# Patient Record
Sex: Male | Born: 1975 | Race: White | Hispanic: No | Marital: Single | Smoking: Former smoker
Health system: Southern US, Community
[De-identification: ages and names within clinical notes are randomized; demographics above are authoritative.]

## PROBLEM LIST (undated history)

## (undated) DIAGNOSIS — E291 Testicular hypofunction: Secondary | ICD-10-CM

## (undated) DIAGNOSIS — T7840XA Allergy, unspecified, initial encounter: Secondary | ICD-10-CM

## (undated) DIAGNOSIS — R011 Cardiac murmur, unspecified: Secondary | ICD-10-CM

## (undated) HISTORY — DX: Allergy, unspecified, initial encounter: T78.40XA

## (undated) HISTORY — PX: MASTECTOMY: SHX3

## (undated) HISTORY — DX: Testicular hypofunction: E29.1

---

## 2005-07-09 ENCOUNTER — Ambulatory Visit: Payer: Self-pay | Admitting: Family Medicine

## 2015-05-05 ENCOUNTER — Other Ambulatory Visit: Payer: Self-pay | Admitting: Family Medicine

## 2015-05-05 DIAGNOSIS — J302 Other seasonal allergic rhinitis: Secondary | ICD-10-CM

## 2015-08-31 ENCOUNTER — Encounter: Payer: Self-pay | Admitting: Family Medicine

## 2015-08-31 ENCOUNTER — Ambulatory Visit (INDEPENDENT_AMBULATORY_CARE_PROVIDER_SITE_OTHER): Payer: 59 | Admitting: Family Medicine

## 2015-08-31 VITALS — BP 100/64 | HR 64 | Ht 65.0 in | Wt 170.0 lb

## 2015-08-31 DIAGNOSIS — F4322 Adjustment disorder with anxiety: Secondary | ICD-10-CM | POA: Diagnosis not present

## 2015-08-31 DIAGNOSIS — G4733 Obstructive sleep apnea (adult) (pediatric): Secondary | ICD-10-CM

## 2015-08-31 DIAGNOSIS — Z79890 Hormone replacement therapy (postmenopausal): Secondary | ICD-10-CM

## 2015-08-31 DIAGNOSIS — G44209 Tension-type headache, unspecified, not intractable: Secondary | ICD-10-CM

## 2015-08-31 MED ORDER — ALPRAZOLAM 0.25 MG PO TABS
0.2500 mg | ORAL_TABLET | ORAL | Status: DC
Start: 2015-08-31 — End: 2016-01-05

## 2015-08-31 MED ORDER — BD SYRINGE LUER SLIP TIP 3 ML MISC: 1.0000 | Status: DC

## 2015-08-31 MED ORDER — TESTOSTERONE CYPIONATE 200 MG/ML IM SOLN: INTRAMUSCULAR | Status: DC

## 2015-08-31 NOTE — Progress Notes (Signed)
Name: Rick Mason   MRN: [id number]    DOB: May 31, 1976   Date:08/31/2015       Progress Note  Subjective  Chief Complaint  Chief Complaint  Patient presents with  . Headache    has had a migraine x 2 days- started a new job with more stress  . Annual Exam    HPI Brief Questions   Visit Type Male to male transgender patient presents with the following complaints:  General negative  Skin No reported concerns, acne, change in mole, hirsutism, hair loss, change in skin texture, facial rubrum, jaundice, pruritis after hot bath, melasma and other   Eyes No reported concerns, diplopia, loss of peripheral vision, decreased visual activity and other   ENT No reported concerns, voice change, dysphasia, increased snoring and other  negative  Breasts  No concerns  Respiratory headache  Cardiovascular No concerns  Gastrointestnal no concerns  Urologic none  GYN No concerns  Musculoskeletal negative  Neurologic neg   Endocrine diabetic symptoms neg  Heme/Lymph  noconcern  Psychiatric negative    Headache  This is a recurrent problem. The current episode started more than 1 month ago. The problem occurs intermittently. The problem has been gradually worsening. The pain is located in the bilateral (generalized) region. The pain does not radiate. The pain quality is not similar to prior headaches. The quality of the pain is described as aching. The pain is at a severity of 6/10. The pain is moderate. Associated symptoms include blurred vision, dizziness, insomnia, nausea, phonophobia and photophobia. Pertinent negatives include no abdominal pain, back pain, coughing, ear pain, fever, neck pain, sore throat, tingling or weight loss. Associated symptoms comments: Concentration difficulty. The symptoms are aggravated by work. He has tried acetaminophen and NSAIDs for the symptoms. The treatment provided mild relief. There is no history of cluster headaches or  hypertension.  Anxiety Presents for follow-up visit. Symptoms include dizziness, insomnia and nausea. Patient reports no chest pain, nervous/anxious behavior, palpitations, shortness of breath or suicidal ideas. The severity of symptoms is moderate.   Past treatments include nothing.     No problem-specific assessment & plan notes found for this encounter.   Past Medical History  Diagnosis Date  . Hypogonadism in male   . Allergy     Past Surgical History  Procedure Laterality Date  . Mastectomy Bilateral     History reviewed. No pertinent family history.  Social History   Social History  . Marital Status: Single    Spouse Name: N/A  . Number of Children: N/A  . Years of Education: N/A   Occupational History  . Not on file.   Social History Main Topics  . Smoking status: Former Smoker    Quit date: 01/21/2015  . Smokeless tobacco: Not on file  . Alcohol Use: No  . Drug Use: No  . Sexual Activity: Yes   Other Topics Concern  . Not on file   Social History Narrative  . No narrative on file    Allergies  Allergen Reactions  . Penicillins      Review of Systems  Constitutional: Negative for fever, chills, weight loss and malaise/fatigue.  HENT: Negative for ear discharge, ear pain and sore throat.   Eyes: Positive for blurred vision and photophobia.  Respiratory: Negative for cough, sputum production, shortness of breath and wheezing.   Cardiovascular: Negative for chest pain, palpitations and leg swelling.  Gastrointestinal: Positive for nausea. Negative for heartburn, abdominal pain, diarrhea, constipation, blood in stool  and melena.  Genitourinary: Negative for dysuria, urgency, frequency and hematuria.  Musculoskeletal: Negative for myalgias, back pain, joint pain and neck pain.  Skin: Negative for rash.  Neurological: Positive for dizziness and headaches. Negative for tingling, sensory change and focal weakness.  Endo/Heme/Allergies: Negative for  environmental allergies and polydipsia. Does not bruise/bleed easily.  Psychiatric/Behavioral: Negative for depression and suicidal ideas. The patient has insomnia. The patient is not nervous/anxious.      Objective  Filed Vitals:   08/31/15 1045  BP: 100/64  Pulse: 64  Height:  (1.651 m)  Weight: 170 lb (77.111 kg)    Physical Exam  Constitutional: He is oriented to person, place, and time and well-developed, well-nourished, and in no distress.  HENT:  Head: Normocephalic.  Right Ear: External ear normal.  Left Ear: External ear normal.  Nose: Nose normal.  Mouth/Throat: Oropharynx is clear and moist.  Eyes: Conjunctivae and EOM are normal. Pupils are equal, round, and reactive to light. Right eye exhibits no discharge. Left eye exhibits no discharge. No scleral icterus.  Neck: Normal range of motion. Neck supple. No JVD present. No tracheal deviation present. No thyromegaly present.  Cardiovascular: Normal rate, regular rhythm, normal heart sounds and intact distal pulses.  Exam reveals no gallop and no friction rub.   No murmur heard. Pulmonary/Chest: Breath sounds normal. No respiratory distress. He has no wheezes. He has no rales.  Abdominal: Soft. Bowel sounds are normal. He exhibits no mass. There is no hepatosplenomegaly. There is no tenderness. There is no rebound, no guarding and no CVA tenderness.  Musculoskeletal: Normal range of motion. He exhibits no edema or tenderness.  Lymphadenopathy:    He has no cervical adenopathy.  Neurological: He is alert and oriented to person, place, and time. He has normal sensation, normal strength, normal reflexes and intact cranial nerves. No cranial nerve deficit.  Skin: Skin is warm. No rash noted.  Psychiatric: Mood and affect normal.      Assessment & Plan  Problem List Items Addressed This Visit    None    Visit Diagnoses    Hormone replacement therapy (HRT)    -  Primary    Relevant Medications    testosterone  cypionate (DEPOTESTOSTERONE CYPIONATE) 200 MG/ML injection    B-D SYRINGE LUER SLIP TIP 3CC 3 ML MISC    Tension headache        Adjustment disorder with anxious mood        Relevant Medications    ALPRAZolam (XANAX) 0.25 MG tablet    Obstructive sleep apnea hypopnea, moderate        pt to call         Dr. Hayden [facility]  08/31/2015

## 2015-08-31 NOTE — Patient Instructions (Signed)

## 2015-11-15 ENCOUNTER — Encounter: Payer: Self-pay | Admitting: Family Medicine

## 2015-11-15 ENCOUNTER — Ambulatory Visit (INDEPENDENT_AMBULATORY_CARE_PROVIDER_SITE_OTHER): Payer: 59 | Admitting: Family Medicine

## 2015-11-15 VITALS — BP 100/64 | HR 88 | Temp 98.0°F | Ht 65.0 in | Wt 172.0 lb

## 2015-11-15 DIAGNOSIS — J0100 Acute maxillary sinusitis, unspecified: Secondary | ICD-10-CM

## 2015-11-15 DIAGNOSIS — J40 Bronchitis, not specified as acute or chronic: Secondary | ICD-10-CM

## 2015-11-15 MED ORDER — AZITHROMYCIN 250 MG PO TABS: ORAL_TABLET | ORAL | Status: DC

## 2015-11-15 MED ORDER — GUAIFENESIN-CODEINE 100-10 MG/5ML PO SOLN: 5.0000 mL | Freq: Three times a day (TID) | ORAL | Status: DC | PRN

## 2015-11-15 NOTE — Progress Notes (Signed)
Name: Rick Mason   MRN: [id number]    DOB: [birth year]   Date:11/15/2015       Progress Note  Subjective  Chief Complaint  Chief Complaint  Patient presents with  . Sinusitis    cough and cong x 2 weeks- taking Mucinex and Nyquil- coughing up brown mucus    Sinusitis This is a new problem. The current episode started 1 to 4 weeks ago. The problem has been waxing and waning since onset. There has been no fever. He is experiencing no pain. Associated symptoms include congestion, coughing, sinus pressure and sneezing. Pertinent negatives include no chills, diaphoresis, ear pain, headaches, hoarse voice, neck pain, shortness of breath, sore throat or swollen glands. Past treatments include acetaminophen and oral decongestants. The treatment provided no relief.  Cough This is a new problem. The current episode started 1 to 4 weeks ago. The problem has been waxing and waning. The cough is productive of brown sputum. Associated symptoms include nasal congestion, postnasal drip and rhinorrhea. Pertinent negatives include no chest pain, chills, ear pain, fever, headaches, heartburn, hemoptysis, myalgias, rash, sore throat, shortness of breath, weight loss or wheezing. The treatment provided mild relief. There is no history of asthma, bronchiectasis, bronchitis, COPD, emphysema, environmental allergies or pneumonia.    No problem-specific assessment & plan notes found for this encounter.   Past Medical History  Diagnosis Date  . Hypogonadism in male   . Allergy     Past Surgical History  Procedure Laterality Date  . Mastectomy Bilateral     History reviewed. No pertinent family history.  Social History   Social History  . Marital Status: Single    Spouse Name: N/A  . Number of Children: N/A  . Years of Education: N/A   Occupational History  . Not on file.   Social History Main Topics  . Smoking status: Former Smoker    Quit date: 01/21/2015  . Smokeless tobacco: Not on file  .  Alcohol Use: No  . Drug Use: No  . Sexual Activity: Yes   Other Topics Concern  . Not on file   Social History Narrative    Allergies  Allergen Reactions  . Penicillins      Review of Systems  Constitutional: Negative for fever, chills, weight loss, malaise/fatigue and diaphoresis.  HENT: Positive for congestion, postnasal drip, rhinorrhea, sinus pressure and sneezing. Negative for ear discharge, ear pain, hoarse voice and sore throat.   Eyes: Negative for blurred vision.  Respiratory: Positive for cough. Negative for hemoptysis, sputum production, shortness of breath and wheezing.   Cardiovascular: Negative for chest pain, palpitations and leg swelling.  Gastrointestinal: Negative for heartburn, nausea, abdominal pain, diarrhea, constipation, blood in stool and melena.  Genitourinary: Negative for dysuria, urgency, frequency and hematuria.  Musculoskeletal: Negative for myalgias, back pain, joint pain and neck pain.  Skin: Negative for rash.  Neurological: Negative for dizziness, tingling, sensory change, focal weakness and headaches.  Endo/Heme/Allergies: Negative for environmental allergies and polydipsia. Does not bruise/bleed easily.  Psychiatric/Behavioral: Negative for depression and suicidal ideas. The patient is not nervous/anxious and does not have insomnia.      Objective  Filed Vitals:   11/15/15 0937  BP: 100/64  Pulse: 88  Temp: 98 F (36.7 C)  TempSrc: Oral  Height:  (1.651 m)  Weight: 172 lb (78.019 kg)    Physical Exam  Constitutional: He is oriented to person, place, and time and well-developed, well-nourished, and in no distress.  HENT:  Head:  Normocephalic.  Right Ear: External ear normal.  Left Ear: External ear normal.  Nose: Nose normal.  Mouth/Throat: Oropharynx is clear and moist.  Eyes: Conjunctivae and EOM are normal. Pupils are equal, round, and reactive to light. Right eye exhibits no discharge. Left eye exhibits no discharge. No  scleral icterus.  Neck: Normal range of motion. Neck supple. No JVD present. No tracheal deviation present. No thyromegaly present.  Cardiovascular: Normal rate, regular rhythm, normal heart sounds and intact distal pulses.  Exam reveals no gallop and no friction rub.   No murmur heard. Pulmonary/Chest: Breath sounds normal. No respiratory distress. He has no wheezes. He has no rales.  Abdominal: Soft. Bowel sounds are normal. He exhibits no mass. There is no hepatosplenomegaly. There is no tenderness. There is no rebound, no guarding and no CVA tenderness.  Musculoskeletal: Normal range of motion. He exhibits no edema or tenderness.  Lymphadenopathy:    He has no cervical adenopathy.  Neurological: He is alert and oriented to person, place, and time. He has normal sensation, normal strength, normal reflexes and intact cranial nerves. No cranial nerve deficit.  Skin: Skin is warm. No rash noted.  Psychiatric: Mood and affect normal.  Nursing note and vitals reviewed.     Assessment & Plan  Problem List Items Addressed This Visit    None    Visit Diagnoses    Acute maxillary sinusitis, recurrence not specified    -  Primary    Relevant Medications    guaiFENesin-codeine 100-10 MG/5ML syrup    azithromycin (ZITHROMAX) 250 MG tablet    Bronchitis             Dr. Hayden Rasmusseneanna Cyrstal [facility]  11/15/2015

## 2015-12-07 ENCOUNTER — Ambulatory Visit: Payer: Self-pay | Admitting: Family Medicine

## 2015-12-15 ENCOUNTER — Ambulatory Visit: Payer: Self-pay | Admitting: Family Medicine

## 2016-01-05 ENCOUNTER — Encounter: Payer: Self-pay | Admitting: Family Medicine

## 2016-01-05 ENCOUNTER — Ambulatory Visit (INDEPENDENT_AMBULATORY_CARE_PROVIDER_SITE_OTHER): Payer: 59 | Admitting: Family Medicine

## 2016-01-05 VITALS — BP 120/80 | HR 84 | Ht 65.0 in | Wt 170.0 lb

## 2016-01-05 DIAGNOSIS — Z79899 Other long term (current) drug therapy: Secondary | ICD-10-CM

## 2016-01-05 DIAGNOSIS — Z79890 Hormone replacement therapy (postmenopausal): Secondary | ICD-10-CM

## 2016-01-05 DIAGNOSIS — Z124 Encounter for screening for malignant neoplasm of cervix: Secondary | ICD-10-CM

## 2016-01-05 DIAGNOSIS — Z1211 Encounter for screening for malignant neoplasm of colon: Secondary | ICD-10-CM

## 2016-01-05 DIAGNOSIS — Z0000 Encounter for general adult medical examination without abnormal findings: Secondary | ICD-10-CM | POA: Diagnosis not present

## 2016-01-05 DIAGNOSIS — G4730 Sleep apnea, unspecified: Secondary | ICD-10-CM | POA: Diagnosis not present

## 2016-01-05 LAB — HEMOCCULT GUIAC POC 1CARD (OFFICE): Fecal Occult Blood, POC: NEGATIVE

## 2016-01-05 MED ORDER — TESTOSTERONE CYPIONATE 200 MG/ML IM SOLN: INTRAMUSCULAR | Status: DC

## 2016-01-05 NOTE — Progress Notes (Addendum)
Name: Rick Mason   MRN: [id number]    DOB: Apr 15, 1976   Date:01/05/2016       Progress Note  Subjective  Chief Complaint  Chief Complaint  Patient presents with  . Allergic Rhinitis   . Hypogonadism  . Sleep Apnea    Other Chronicity: sleep apnea. The current episode started more than 1 year ago. The problem has been gradually worsening. Associated symptoms comments: Snores/ daytime somnolence/ witness apneic episodes. Nothing aggravates the symptoms. The treatment provided no relief.    No problem-specific assessment & plan notes found for this encounter.   Past Medical History  Diagnosis Date  . Hypogonadism in male   . Allergy     Past Surgical History  Procedure Laterality Date  . Mastectomy Bilateral     Family History  Problem Relation Age of Onset  . Heart disease Father   . Cancer Maternal Grandfather   . Heart disease Maternal Grandfather   . Cancer Paternal Grandmother   . Cancer Paternal Grandfather     Social History   Social History  . Marital Status: Single    Spouse Name: N/A  . Number of Children: N/A  . Years of Education: N/A   Occupational History  . Not on file.   Social History Main Topics  . Smoking status: Former Smoker    Quit date: 01/21/2015  . Smokeless tobacco: Not on file  . Alcohol Use: No  . Drug Use: No  . Sexual Activity: Yes   Other Topics Concern  . Not on file   Social History Narrative    Allergies  Allergen Reactions  . Penicillins      ROS   Objective  Filed Vitals:   01/05/16 0939  BP: 120/80  Pulse: 84  Height:  (1.651 m)  Weight: 170 lb (77.111 kg)    Physical Exam    Assessment & Plan  Problem List Items Addressed This Visit    None    Visit Diagnoses    Hormone replacement therapy (HRT)    -  Primary    Relevant Medications    testosterone cypionate (DEPOTESTOSTERONE CYPIONATE) 200 MG/ML injection    Other Relevant Orders    Pap IG (Image Guided)    Healthcare  maintenance        Relevant Orders    POCT Occult Blood Stool (Completed)    Encounter for long-term (current) drug use        Relevant Orders    Lipid Profile    Renal Function Panel    Hemoglobin    Cervical cancer screening        Relevant Orders    Pap IG (Image Guided)    Colon cancer screening        Relevant Orders    POCT Occult Blood Stool (Completed)    Sleep apnea        referral for eval sleep apnea         Dr. Elizabeth Sauer [facility]  01/05/2016

## 2016-01-05 NOTE — Addendum Note (Signed)
Addended by: Elizabeth Sauer C on: 01/05/2016 11:56 AM   Modules accepted: Kipp Brood

## 2016-01-05 NOTE — Progress Notes (Signed)
Name: Rick Mason   MRN: [id number]    DOB: 1976/11/15   Date:01/05/2016       Progress Note  Subjective  Chief Complaint  Chief Complaint  Patient presents with  . Allergic Rhinitis   . Hypogonadism  . Sleep Apnea    HPI Comments: Patient presents for hormone replacement therapy.   No problem-specific assessment & plan notes found for this encounter.   Past Medical History  Diagnosis Date  . Hypogonadism in male   . Allergy     Past Surgical History  Procedure Laterality Date  . Mastectomy Bilateral     Family History  Problem Relation Age of Onset  . Heart disease Father   . Cancer Maternal Grandfather   . Heart disease Maternal Grandfather   . Cancer Paternal Grandmother   . Cancer Paternal Grandfather     Social History   Social History  . Marital Status: Single    Spouse Name: N/A  . Number of Children: N/A  . Years of Education: N/A   Occupational History  . Not on file.   Social History Main Topics  . Smoking status: Former Smoker    Quit date: 01/21/2015  . Smokeless tobacco: Not on file  . Alcohol Use: No  . Drug Use: No  . Sexual Activity: Yes   Other Topics Concern  . Not on file   Social History Narrative    Allergies  Allergen Reactions  . Penicillins      Review of Systems  Constitutional: Negative for fever, chills, weight loss and malaise/fatigue.  HENT: Negative for ear discharge, ear pain and sore throat.   Eyes: Negative for blurred vision.  Respiratory: Negative for cough, sputum production, shortness of breath and wheezing.   Cardiovascular: Negative for chest pain, palpitations and leg swelling.  Gastrointestinal: Negative for heartburn, nausea, abdominal pain, diarrhea, constipation, blood in stool and melena.  Genitourinary: Negative for dysuria, urgency, frequency and hematuria.  Musculoskeletal: Negative for myalgias, back pain, joint pain and neck pain.  Skin: Negative for rash.  Neurological: Negative for  dizziness, tingling, sensory change, focal weakness and headaches.  Endo/Heme/Allergies: Negative for environmental allergies and polydipsia. Does not bruise/bleed easily.  Psychiatric/Behavioral: Negative for depression and suicidal ideas. The patient is not nervous/anxious and does not have insomnia.      Objective  Filed Vitals:   01/05/16 0939  BP: 120/80  Pulse: 84  Height:  (1.651 m)  Weight: 170 lb (77.111 kg)    Physical Exam  Constitutional: He is oriented to person, place, and time and well-developed, well-nourished, and in no distress.  HENT:  Head: Normocephalic.  Right Ear: External ear normal.  Left Ear: External ear normal.  Nose: Nose normal.  Mouth/Throat: Oropharynx is clear and moist.  Eyes: Conjunctivae and EOM are normal. Pupils are equal, round, and reactive to light. Right eye exhibits no discharge. Left eye exhibits no discharge. No scleral icterus.  Neck: Normal range of motion. Neck supple. No JVD present. No tracheal deviation present. No thyromegaly present.  Cardiovascular: Normal rate, regular rhythm, normal heart sounds and intact distal pulses.  Exam reveals no gallop and no friction rub.   No murmur heard. Pulmonary/Chest: Breath sounds normal. No respiratory distress. He has no wheezes. He has no rales.  Abdominal: Soft. Bowel sounds are normal. He exhibits no mass. There is no hepatosplenomegaly. There is no tenderness. There is no rebound, no guarding and no CVA tenderness.  Genitourinary: Rectum normal.  Ext nl/vag nl/uterus  nl/ adnexa nl/ rectal nl  Musculoskeletal: Normal range of motion. He exhibits no edema or tenderness.  Lymphadenopathy:    He has no cervical adenopathy.  Neurological: He is alert and oriented to person, place, and time. He has normal sensation, normal strength, normal reflexes and intact cranial nerves. No cranial nerve deficit.  Skin: Skin is warm. No rash noted.  Psychiatric: Mood and affect normal.  Nursing  note and vitals reviewed.     Assessment & Plan  Problem List Items Addressed This Visit    None    Visit Diagnoses    Hormone replacement therapy (HRT)    -  Primary    Relevant Medications    testosterone cypionate (DEPOTESTOSTERONE CYPIONATE) 200 MG/ML injection    Other Relevant Orders    Pap IG (Image Guided)    Healthcare maintenance        Relevant Orders    POCT Occult Blood Stool (Completed)    Encounter for long-term (current) drug use        Relevant Orders    Lipid Profile    Renal Function Panel    Hemoglobin    Cervical cancer screening        Relevant Orders    Pap IG (Image Guided)    Colon cancer screening        Relevant Orders    POCT Occult Blood Stool (Completed)         Dr. Elizabeth Sauer [facility]  01/05/2016

## 2016-01-05 NOTE — Addendum Note (Signed)
Addended by: Elizabeth Sauer C on: 01/05/2016 11:55 AM   Modules accepted: Kipp Brood

## 2016-01-06 LAB — RENAL FUNCTION PANEL
ALBUMIN: 4.8 g/dL (ref 3.5–5.5)
BUN/Creatinine Ratio: 7 — ABNORMAL LOW (ref 8–19)
BUN: 8 mg/dL (ref 6–20)
CALCIUM: 9.6 mg/dL (ref 8.7–10.2)
CHLORIDE: 101 mmol/L (ref 96–106)
CO2: 24 mmol/L (ref 18–29)
Creatinine, Ser: 1.09 mg/dL (ref 0.76–1.27)
GFR calc Af Amer: 100 mL/min/{1.73_m2} (ref >59)
GFR calc non Af Amer: 86 mL/min/{1.73_m2} (ref >59)
Glucose: 88 mg/dL (ref 65–99)
Phosphorus: 2.8 mg/dL (ref 2.5–4.5)
Potassium: 4.5 mmol/L (ref 3.5–5.2)
Sodium: 142 mmol/L (ref 134–144)

## 2016-01-06 LAB — LIPID PANEL
Chol/HDL Ratio: 5.9 ratio units — ABNORMAL HIGH (ref 0.0–5.0)
Cholesterol, Total: 218 mg/dL — ABNORMAL HIGH (ref 100–199)
HDL: 37 mg/dL — ABNORMAL LOW (ref >39)
LDL Calculated: 155 mg/dL — ABNORMAL HIGH (ref 0–99)
TRIGLYCERIDES: 130 mg/dL (ref 0–149)
VLDL CHOLESTEROL CAL: 26 mg/dL (ref 5–40)

## 2016-01-06 LAB — HEMOGLOBIN: HEMOGLOBIN: 16.5 g/dL (ref 12.6–17.7)

## 2016-01-09 LAB — PAP IG (IMAGE GUIDED): PAP SMEAR COMMENT: 0

## 2016-02-27 ENCOUNTER — Encounter: Payer: Self-pay | Admitting: Family Medicine

## 2016-02-27 ENCOUNTER — Ambulatory Visit (INDEPENDENT_AMBULATORY_CARE_PROVIDER_SITE_OTHER): Payer: 59 | Admitting: Family Medicine

## 2016-02-27 ENCOUNTER — Ambulatory Visit
Admission: RE | Admit: 2016-02-27 | Discharge: 2016-02-27 | Disposition: A | Discharge status: Home / Self Care | Payer: 59 | Source: Ambulatory Visit | Attending: Family Medicine | Admitting: Family Medicine

## 2016-02-27 VITALS — BP 120/80 | HR 68 | Ht 65.0 in | Wt 169.0 lb

## 2016-02-27 DIAGNOSIS — M25471 Effusion, right ankle: Secondary | ICD-10-CM | POA: Insufficient documentation

## 2016-02-27 DIAGNOSIS — X58XXXA Exposure to other specified factors, initial encounter: Secondary | ICD-10-CM | POA: Insufficient documentation

## 2016-02-27 DIAGNOSIS — S93401A Sprain of unspecified ligament of right ankle, initial encounter: Secondary | ICD-10-CM | POA: Diagnosis not present

## 2016-02-27 NOTE — Progress Notes (Signed)
Name: Rick Mason   MRN: [id number]    DOB: [birth year]   Date:02/27/2016       Progress Note  Subjective  Chief Complaint  Chief Complaint  Patient presents with  . Ankle Pain    sprained R) ankle x 1 month ago- still bothering him    Ankle Pain  The incident occurred more than 1 week ago (6 weeks ago). The incident occurred in the street. The injury mechanism was a fall. The pain is present in the right ankle. The quality of the pain is described as aching. The pain is moderate. The pain has been improving since onset. Associated symptoms include a loss of motion and muscle weakness. Pertinent negatives include no inability to bear weight or tingling. The symptoms are aggravated by movement, palpation and weight bearing. He has tried NSAIDs for the symptoms. The treatment provided mild relief.    No problem-specific assessment & plan notes found for this encounter.   Past Medical History  Diagnosis Date  . Hypogonadism in male   . Allergy     Past Surgical History  Procedure Laterality Date  . Mastectomy Bilateral     Family History  Problem Relation Age of Onset  . Heart disease Father   . Cancer Maternal Grandfather   . Heart disease Maternal Grandfather   . Cancer Paternal Grandmother   . Cancer Paternal Grandfather     Social History   Social History  . Marital Status: Single    Spouse Name: N/A  . Number of Children: N/A  . Years of Education: N/A   Occupational History  . Not on file.   Social History Main Topics  . Smoking status: Former Smoker    Quit date: 01/21/2015  . Smokeless tobacco: Not on file  . Alcohol Use: No  . Drug Use: No  . Sexual Activity: Yes   Other Topics Concern  . Not on file   Social History Narrative    Allergies  Allergen Reactions  . Penicillins      Review of Systems  Constitutional: Negative for fever, chills, weight loss and malaise/fatigue.  HENT: Negative for ear discharge, ear pain and sore throat.    Eyes: Negative for blurred vision.  Respiratory: Negative for cough, sputum production, shortness of breath and wheezing.   Cardiovascular: Negative for chest pain, palpitations and leg swelling.  Gastrointestinal: Negative for heartburn, nausea, abdominal pain, diarrhea, constipation, blood in stool and melena.  Genitourinary: Negative for dysuria, urgency, frequency and hematuria.  Musculoskeletal: Positive for joint pain. Negative for myalgias, back pain and neck pain.  Skin: Negative for rash.  Neurological: Negative for dizziness, tingling, sensory change, focal weakness and headaches.  Endo/Heme/Allergies: Negative for environmental allergies and polydipsia. Does not bruise/bleed easily.  Psychiatric/Behavioral: Negative for depression and suicidal ideas. The patient is not nervous/anxious and does not have insomnia.      Objective  Filed Vitals:   02/27/16 1342  BP: 120/80  Pulse: 68  Height:  (1.651 m)  Weight: 169 lb (76.658 kg)    Physical Exam  Constitutional: He is oriented to person, place, and time and well-developed, well-nourished, and in no distress.  HENT:  Head: Normocephalic.  Right Ear: External ear normal.  Left Ear: External ear normal.  Nose: Nose normal.  Mouth/Throat: Oropharynx is clear and moist.  Eyes: Conjunctivae and EOM are normal. Pupils are equal, round, and reactive to light. Right eye exhibits no discharge. Left eye exhibits no discharge. No scleral icterus.  Neck: Normal range of motion. Neck supple. No JVD present. No tracheal deviation present. No thyromegaly present.  Cardiovascular: Normal rate, regular rhythm, normal heart sounds and intact distal pulses.  Exam reveals no gallop and no friction rub.   No murmur heard. Pulmonary/Chest: Breath sounds normal. No respiratory distress. He has no wheezes. He has no rales.  Abdominal: Soft. Bowel sounds are normal. He exhibits no mass. There is no hepatosplenomegaly. There is no  tenderness. There is no rebound, no guarding and no CVA tenderness.  Musculoskeletal: Normal range of motion. He exhibits no edema or tenderness.  Lymphadenopathy:    He has no cervical adenopathy.  Neurological: He is alert and oriented to person, place, and time. He has normal sensation, normal strength, normal reflexes and intact cranial nerves. No cranial nerve deficit.  Skin: Skin is warm. No rash noted.  Psychiatric: Mood and affect normal.  Nursing note and vitals reviewed.     Assessment & Plan  Problem List Items Addressed This Visit    None    Visit Diagnoses    Ankle sprain, right, initial encounter    -  Primary    Relevant Orders    DG Ankle Complete Right (Completed)         Dr. [facility]  02/27/2016

## 2016-05-19 ENCOUNTER — Other Ambulatory Visit: Payer: Self-pay | Admitting: Family Medicine

## 2016-07-17 ENCOUNTER — Other Ambulatory Visit: Payer: Self-pay | Admitting: Family Medicine

## 2016-07-17 DIAGNOSIS — Z79890 Hormone replacement therapy (postmenopausal): Secondary | ICD-10-CM

## 2016-07-23 ENCOUNTER — Encounter (INDEPENDENT_AMBULATORY_CARE_PROVIDER_SITE_OTHER): Payer: Self-pay

## 2016-07-23 ENCOUNTER — Encounter: Payer: Self-pay | Admitting: Family Medicine

## 2016-07-23 ENCOUNTER — Ambulatory Visit (INDEPENDENT_AMBULATORY_CARE_PROVIDER_SITE_OTHER): Payer: 59 | Admitting: Family Medicine

## 2016-07-23 VITALS — BP 120/80 | HR 78 | Ht 65.0 in | Wt 169.0 lb

## 2016-07-23 DIAGNOSIS — Z79890 Hormone replacement therapy (postmenopausal): Secondary | ICD-10-CM

## 2016-07-23 DIAGNOSIS — J302 Other seasonal allergic rhinitis: Secondary | ICD-10-CM | POA: Diagnosis not present

## 2016-07-23 DIAGNOSIS — M19079 Primary osteoarthritis, unspecified ankle and foot: Secondary | ICD-10-CM

## 2016-07-23 DIAGNOSIS — B349 Viral infection, unspecified: Secondary | ICD-10-CM

## 2016-07-23 DIAGNOSIS — M129 Arthropathy, unspecified: Secondary | ICD-10-CM | POA: Diagnosis not present

## 2016-07-23 MED ORDER — TESTOSTERONE CYPIONATE 200 MG/ML IM SOLN: INTRAMUSCULAR | 2 refills | Status: DC

## 2016-07-23 MED ORDER — VALACYCLOVIR HCL 1 G PO TABS: ORAL_TABLET | ORAL | 11 refills | Status: DC

## 2016-07-23 MED ORDER — LORATADINE 10 MG PO TABS: 10.0000 mg | ORAL_TABLET | ORAL | 11 refills | Status: DC | PRN

## 2016-07-23 MED ORDER — FLUTICASONE PROPIONATE 50 MCG/ACT NA SUSP
NASAL | 11 refills | Status: DC
Start: 2016-07-23 — End: 2017-03-06

## 2016-07-23 MED ORDER — BD SYRINGE LUER SLIP TIP 3 ML MISC: 1.0000 | 11 refills | Status: DC

## 2016-07-23 MED ORDER — IBUPROFEN 800 MG PO TABS: 800.0000 mg | ORAL_TABLET | Freq: Three times a day (TID) | ORAL | 11 refills | Status: DC | PRN

## 2016-07-23 NOTE — Progress Notes (Signed)
Name: Rick Mason   MRN: [id number]    DOB: 11/01/1976   Date:07/23/2016       Progress Note  Subjective  Chief Complaint  Chief Complaint  Patient presents with  . hormone replacement therapy  . Mouth Lesions  . Allergic Rhinitis     Patient presents for medication hormone replacement therapy.   Mouth Lesions   The current episode started more than 1 week ago. The onset was gradual. The problem occurs continuously. The problem has been unchanged. The problem is moderate. Nothing relieves the symptoms. Nothing aggravates the symptoms. Associated symptoms include mouth sores. Pertinent negatives include no fever, no decreased vision, no double vision, no eye itching, no photophobia, no abdominal pain, no constipation, no diarrhea, no nausea, no congestion, no ear discharge, no ear pain, no headaches, no hearing loss, no rhinorrhea, no sore throat, no stridor, no swollen glands, no neck pain, no cough, no wheezing, no rash, no eye discharge, no eye pain and no eye redness.    No problem-specific Assessment & Plan notes found for this encounter.   Past Medical History:  Diagnosis Date  . Allergy   . Hypogonadism in male     Past Surgical History:  Procedure Laterality Date  . MASTECTOMY Bilateral     Family History  Problem Relation Age of Onset  . Heart disease Father   . Cancer Maternal Grandfather   . Heart disease Maternal Grandfather   . Cancer Paternal Grandmother   . Cancer Paternal Grandfather     Social History   Social History  . Marital status: Single    Spouse name: N/A  . Number of children: N/A  . Years of education: N/A   Occupational History  . Not on file.   Social History Main Topics  . Smoking status: Former Smoker    Quit date: 01/21/2015  . Smokeless tobacco: Not on file  . Alcohol use No  . Drug use: No  . Sexual activity: Yes   Other Topics Concern  . Not on file   Social History Narrative  . No narrative on file    Allergies   Allergen Reactions  . Penicillins      Review of Systems  Constitutional: Negative for chills, fever, malaise/fatigue and weight loss.  HENT: Positive for mouth sores. Negative for congestion, ear discharge, ear pain, hearing loss, rhinorrhea and sore throat.   Eyes: Negative for blurred vision, double vision, photophobia, pain, discharge, redness and itching.  Respiratory: Negative for cough, sputum production, shortness of breath, wheezing and stridor.   Cardiovascular: Negative for chest pain, palpitations and leg swelling.  Gastrointestinal: Negative for abdominal pain, blood in stool, constipation, diarrhea, heartburn, melena and nausea.  Genitourinary: Negative for dysuria, frequency, hematuria and urgency.  Musculoskeletal: Negative for back pain, joint pain, myalgias and neck pain.  Skin: Negative for rash.  Neurological: Negative for dizziness, tingling, sensory change, focal weakness and headaches.  Endo/Heme/Allergies: Negative for environmental allergies and polydipsia. Does not bruise/bleed easily.  Psychiatric/Behavioral: Negative for depression and suicidal ideas. The patient is not nervous/anxious and does not have insomnia.      Objective  Vitals:   07/23/16 0833  BP: 120/80  Pulse: 78  Weight: 169 lb (76.7 kg)  Height: 5\' 5"  (1.651 m)    Physical Exam  Constitutional: He is oriented to person, place, and time and well-developed, well-nourished, and in no distress.  HENT:  Head: Normocephalic.  Right Ear: External ear normal.  Left Ear: External ear normal.  Nose: Nose normal.  Mouth/Throat: Oropharynx is clear and moist.  Eyes: Conjunctivae and EOM are normal. Pupils are equal, round, and reactive to light. Right eye exhibits no discharge. Left eye exhibits no discharge. No scleral icterus.  Neck: Normal range of motion. Neck supple. No JVD present. No tracheal deviation present. No thyromegaly present.  Cardiovascular: Normal rate, regular rhythm, normal  heart sounds and intact distal pulses.  Exam reveals no gallop and no friction rub.   No murmur heard. Pulmonary/Chest: Breath sounds normal. No respiratory distress. He has no wheezes. He has no rales.  Abdominal: Soft. Bowel sounds are normal. He exhibits no mass. There is no hepatosplenomegaly. There is no tenderness. There is no rebound, no guarding and no CVA tenderness.  Musculoskeletal: Normal range of motion. He exhibits no edema or tenderness.  Lymphadenopathy:    He has no cervical adenopathy.  Neurological: He is alert and oriented to person, place, and time. He has normal sensation, normal strength, normal reflexes and intact cranial nerves. No cranial nerve deficit.  Skin: Skin is warm. No rash noted.  Psychiatric: Mood and affect normal.  Nursing note and vitals reviewed.     Assessment & Plan  Problem List Items Addressed This Visit    None    Visit Diagnoses    Hormone replacement therapy (HRT)    -  Primary   Relevant Medications   testosterone cypionate (DEPOTESTOSTERONE CYPIONATE) 200 MG/ML injection   B-D SYRINGE LUER SLIP TIP 3CC 3 ML MISC   Viral syndrome       Relevant Medications   valACYclovir (VALTREX) 1000 MG tablet   Ankle arthritis       Other seasonal allergic rhinitis       Relevant Medications   loratadine (CLARITIN) 10 MG tablet   fluticasone (FLONASE) 50 MCG/ACT nasal spray        Dr. Hayden Rasmusseneanna Emri Sample [facility]  07/23/16

## 2016-09-13 ENCOUNTER — Other Ambulatory Visit: Payer: Self-pay

## 2016-09-13 DIAGNOSIS — G4719 Other hypersomnia: Secondary | ICD-10-CM

## 2016-09-13 DIAGNOSIS — R0683 Snoring: Secondary | ICD-10-CM

## 2016-09-17 ENCOUNTER — Other Ambulatory Visit: Payer: Self-pay

## 2016-09-17 DIAGNOSIS — M25579 Pain in unspecified ankle and joints of unspecified foot: Secondary | ICD-10-CM

## 2016-10-21 ENCOUNTER — Other Ambulatory Visit: Payer: Self-pay

## 2016-10-22 ENCOUNTER — Ambulatory Visit: Payer: 59 | Attending: Specialist

## 2016-10-22 DIAGNOSIS — R0683 Snoring: Secondary | ICD-10-CM | POA: Diagnosis present

## 2016-10-22 DIAGNOSIS — G4730 Sleep apnea, unspecified: Secondary | ICD-10-CM | POA: Insufficient documentation

## 2017-03-06 ENCOUNTER — Other Ambulatory Visit: Payer: Self-pay

## 2017-03-06 ENCOUNTER — Encounter: Payer: Self-pay | Admitting: Family Medicine

## 2017-03-06 ENCOUNTER — Ambulatory Visit (INDEPENDENT_AMBULATORY_CARE_PROVIDER_SITE_OTHER): Payer: 59 | Admitting: Family Medicine

## 2017-03-06 ENCOUNTER — Telehealth: Payer: Self-pay

## 2017-03-06 VITALS — BP 120/64 | HR 100 | Ht 64.0 in | Wt 163.0 lb

## 2017-03-06 DIAGNOSIS — J302 Other seasonal allergic rhinitis: Secondary | ICD-10-CM | POA: Diagnosis not present

## 2017-03-06 DIAGNOSIS — J40 Bronchitis, not specified as acute or chronic: Secondary | ICD-10-CM | POA: Diagnosis not present

## 2017-03-06 DIAGNOSIS — J0100 Acute maxillary sinusitis, unspecified: Secondary | ICD-10-CM | POA: Diagnosis not present

## 2017-03-06 DIAGNOSIS — Z79890 Hormone replacement therapy (postmenopausal): Secondary | ICD-10-CM

## 2017-03-06 MED ORDER — GUAIFENESIN-CODEINE 100-10 MG/5ML PO SYRP: 5.0000 mL | ORAL_SOLUTION | Freq: Three times a day (TID) | ORAL | 0 refills | Status: DC | PRN

## 2017-03-06 MED ORDER — BD SYRINGE LUER SLIP TIP 3 ML MISC: 1.0000 | 11 refills | Status: DC

## 2017-03-06 MED ORDER — FLUTICASONE PROPIONATE 50 MCG/ACT NA SUSP: NASAL | 11 refills | Status: DC

## 2017-03-06 MED ORDER — TESTOSTERONE CYPIONATE 200 MG/ML IM SOLN: INTRAMUSCULAR | 2 refills | Status: DC

## 2017-03-06 MED ORDER — LORATADINE 10 MG PO TABS: 10.0000 mg | ORAL_TABLET | ORAL | 11 refills | Status: DC | PRN

## 2017-03-06 MED ORDER — AZITHROMYCIN 250 MG PO TABS: ORAL_TABLET | ORAL | 1 refills | Status: DC

## 2017-03-06 NOTE — Telephone Encounter (Signed)
Patient called and wanted testosterone refilled- need to sched appt. Also wanted something for cough/cold. Tell him you can put him on today at 2:00. Thank you

## 2017-03-06 NOTE — Progress Notes (Signed)
Name: Rick Mason   MRN: [id number]    DOB: 1976/05/07   Date:03/06/2017       Progress Note  Subjective  Chief Complaint  Chief Complaint  Patient presents with  . Allergic Rhinitis   . hrt    Needs syringes, needles, flonase nasal spray, and testosterone refilled.  . Bronchitis    brown/ green production, cough- taking Nyquil at night to help sleep and Mucinex during day- has helped some    Patient presents for HRT /testosterone refill.   Sinusitis  This is a new problem. The current episode started in the past 7 days. The problem has been gradually worsening since onset. There has been no fever. The pain is mild. Associated symptoms include congestion, coughing, sinus pressure and a sore throat. Pertinent negatives include no chills, diaphoresis, ear pain, headaches, hoarse voice, neck pain, shortness of breath, sneezing or swollen glands. Past treatments include oral decongestants. The treatment provided mild relief.  Cough  This is a new problem. The current episode started in the past 7 days. The problem has been gradually worsening. The cough is productive of purulent sputum. Associated symptoms include nasal congestion, postnasal drip, a sore throat and wheezing. Pertinent negatives include no chest pain, chills, ear congestion, ear pain, fever, headaches, heartburn, hemoptysis, myalgias, rash, rhinorrhea, shortness of breath, sweats or weight loss. The symptoms are aggravated by pollens. The treatment provided mild relief. His past medical history is significant for bronchitis. There is no history of COPD or environmental allergies.    No problem-specific Assessment & Plan notes found for this encounter.   Past Medical History:  Diagnosis Date  . Allergy   . Hypogonadism in male     Past Surgical History:  Procedure Laterality Date  . MASTECTOMY Bilateral     Family History  Problem Relation Age of Onset  . Heart disease Father   . Cancer Maternal Grandfather   .  Heart disease Maternal Grandfather   . Cancer Paternal Grandmother   . Cancer Paternal Grandfather     Social History   Social History  . Marital status: Single    Spouse name: N/A  . Number of children: N/A  . Years of education: N/A   Occupational History  . Not on file.   Social History Main Topics  . Smoking status: Former Smoker    Quit date: 01/21/2015  . Smokeless tobacco: Never Used  . Alcohol use No  . Drug use: No  . Sexual activity: Yes   Other Topics Concern  . Not on file   Social History Narrative  . No narrative on file    Allergies  Allergen Reactions  . Penicillins     Outpatient Medications Prior to Visit  Medication Sig Dispense Refill  . ibuprofen (ADVIL,MOTRIN) 800 MG tablet Take 1 tablet (800 mg total) by mouth every 8 (eight) hours as needed. 30 tablet 11  . valACYclovir (VALTREX) 1000 MG tablet take 1 tablet by mouth if needed 30 tablet 11  . B-D SYRINGE LUER SLIP TIP 3CC 3 ML MISC Inject 1 each as directed every 14 (fourteen) days. 30 each 11  . fluticasone (FLONASE) 50 MCG/ACT nasal spray instill 1 spray into each nostril once daily 16 g 11  . loratadine (CLARITIN) 10 MG tablet Take 1 tablet (10 mg total) by mouth as needed. 30 tablet 11  . testosterone cypionate (DEPOTESTOSTERONE CYPIONATE) 200 MG/ML injection INJECT 1 AND 1/4 INTRAMUSCULAR EVERY 2 WEEKS 10 mL 2   No facility-administered  medications prior to visit.     Review of Systems  Constitutional: Negative for chills, diaphoresis, fever, malaise/fatigue and weight loss.  HENT: Positive for congestion, postnasal drip, sinus pressure and sore throat. Negative for ear discharge, ear pain, hoarse voice, rhinorrhea and sneezing.   Eyes: Negative for blurred vision.  Respiratory: Positive for cough and wheezing. Negative for hemoptysis, sputum production and shortness of breath.   Cardiovascular: Negative for chest pain, palpitations and leg swelling.  Gastrointestinal: Negative for  abdominal pain, blood in stool, constipation, diarrhea, heartburn, melena and nausea.  Genitourinary: Negative for dysuria, frequency, hematuria and urgency.  Musculoskeletal: Negative for back pain, joint pain, myalgias and neck pain.  Skin: Negative for rash.  Neurological: Negative for dizziness, tingling, sensory change, focal weakness and headaches.  Endo/Heme/Allergies: Negative for environmental allergies and polydipsia. Does not bruise/bleed easily.  Psychiatric/Behavioral: Negative for depression and suicidal ideas. The patient is not nervous/anxious and does not have insomnia.      Objective  Vitals:   03/06/17 1142  BP: 120/64  Pulse: 100  Weight: 163 lb (73.9 kg)  Height:  (1.626 m)    Physical Exam  Constitutional: He is oriented to person, place, and time and well-developed, well-nourished, and in no distress.  HENT:  Head: Normocephalic.  Right Ear: External ear normal.  Left Ear: External ear normal.  Nose: Nose normal.  Mouth/Throat: Oropharynx is clear and moist.  Eyes: Conjunctivae and EOM are normal. Pupils are equal, round, and reactive to light. Right eye exhibits no discharge. Left eye exhibits no discharge. No scleral icterus.  Neck: Normal range of motion. Neck supple. No JVD present. No tracheal deviation present. No thyromegaly present.  Cardiovascular: Normal rate, regular rhythm, normal heart sounds and intact distal pulses.  Exam reveals no gallop and no friction rub.   No murmur heard. Pulmonary/Chest: Breath sounds normal. No respiratory distress. He has no wheezes. He has no rales.  Abdominal: Soft. Bowel sounds are normal. He exhibits no mass. There is no hepatosplenomegaly. There is no tenderness. There is no rebound, no guarding and no CVA tenderness.  Musculoskeletal: Normal range of motion. He exhibits no edema or tenderness.  Lymphadenopathy:    He has no cervical adenopathy.  Neurological: He is alert and oriented to person, place, and  time. He has normal sensation, normal strength and intact cranial nerves. No cranial nerve deficit.  Skin: Skin is warm. No rash noted.  Psychiatric: Mood and affect normal.  Nursing note and vitals reviewed.     Assessment & Plan  Problem List Items Addressed This Visit    None    Visit Diagnoses    Acute non-recurrent maxillary sinusitis    -  Primary   Relevant Medications   fluticasone (FLONASE) 50 MCG/ACT nasal spray   loratadine (CLARITIN) 10 MG tablet   azithromycin (ZITHROMAX) 250 MG tablet   guaiFENesin-codeine (ROBITUSSIN AC) 100-10 MG/5ML syrup   Hormone replacement therapy (HRT)       Relevant Medications   B-D SYRINGE LUER SLIP TIP 3CC 3 ML MISC   testosterone cypionate (DEPOTESTOSTERONE CYPIONATE) 200 MG/ML injection   Other seasonal allergic rhinitis       Relevant Medications   fluticasone (FLONASE) 50 MCG/ACT nasal spray   loratadine (CLARITIN) 10 MG tablet   Bronchitis          Meds ordered this encounter  Medications  . B-D SYRINGE LUER SLIP TIP 3CC 3 ML MISC    Sig: Inject 1 each as directed  every 14 (fourteen) days.    Dispense:  30 each    Refill:  11  . testosterone cypionate (DEPOTESTOSTERONE CYPIONATE) 200 MG/ML injection    Sig: INJECT 1 AND 1/4 INTRAMUSCULAR EVERY 2 WEEKS    Dispense:  10 mL    Refill:  2  . fluticasone (FLONASE) 50 MCG/ACT nasal spray    Sig: instill 1 spray into each nostril once daily    Dispense:  16 g    Refill:  11  . loratadine (CLARITIN) 10 MG tablet    Sig: Take 1 tablet (10 mg total) by mouth as needed.    Dispense:  30 tablet    Refill:  11  . azithromycin (ZITHROMAX) 250 MG tablet    Sig: 2 today then 1 a day for 4 days    Dispense:  6 tablet    Refill:  1  . guaiFENesin-codeine (ROBITUSSIN AC) 100-10 MG/5ML syrup    Sig: Take 5 mLs by mouth 3 (three) times daily as needed for cough.    Dispense:  150 mL    Refill:  0      Dr. Hayden [facility]  03/06/17

## 2017-05-01 ENCOUNTER — Encounter: Payer: Self-pay | Admitting: Family Medicine

## 2017-05-01 ENCOUNTER — Ambulatory Visit (INDEPENDENT_AMBULATORY_CARE_PROVIDER_SITE_OTHER): Payer: 59 | Admitting: Family Medicine

## 2017-05-01 VITALS — BP 102/70 | HR 72 | Ht 64.0 in | Wt 162.0 lb

## 2017-05-01 DIAGNOSIS — E291 Testicular hypofunction: Secondary | ICD-10-CM

## 2017-05-01 DIAGNOSIS — R69 Illness, unspecified: Secondary | ICD-10-CM

## 2017-05-01 DIAGNOSIS — B078 Other viral warts: Secondary | ICD-10-CM | POA: Diagnosis not present

## 2017-05-01 DIAGNOSIS — E782 Mixed hyperlipidemia: Secondary | ICD-10-CM

## 2017-05-01 MED ORDER — DOXYCYCLINE HYCLATE 100 MG PO TABS: 100.0000 mg | ORAL_TABLET | Freq: Two times a day (BID) | ORAL | 0 refills | Status: DC

## 2017-05-01 NOTE — Progress Notes (Signed)
Name: Rick Mason   MRN: [id number]    DOB: 1976/03/01   Date:05/01/2017       Progress Note  Subjective  Chief Complaint  Chief Complaint  Patient presents with  . skin tag    has on L) side of neck- got larger in size/ noticed 5 days ago- referral to derm    Noted a "skin tag" "for a while."  Last week began to rapidly grow.  Feels irritatated"  No bleeding, dischrge, or chnge in color. Patient also needs labs for HRT treatment plan.    No problem-specific Assessment & Plan notes found for this encounter.   Past Medical History:  Diagnosis Date  . Allergy   . Hypogonadism in male     Past Surgical History:  Procedure Laterality Date  . MASTECTOMY Bilateral     Family History  Problem Relation Age of Onset  . Heart disease Father   . Cancer Maternal Grandfather   . Heart disease Maternal Grandfather   . Cancer Paternal Grandmother   . Cancer Paternal Grandfather     Social History   Social History  . Marital status: Single    Spouse name: N/A  . Number of children: N/A  . Years of education: N/A   Occupational History  . Not on file.   Social History Main Topics  . Smoking status: Former Smoker    Quit date: 01/21/2015  . Smokeless tobacco: Never Used  . Alcohol use No  . Drug use: No  . Sexual activity: Yes   Other Topics Concern  . Not on file   Social History Narrative  . No narrative on file    Allergies  Allergen Reactions  . Penicillins     Outpatient Medications Prior to Visit  Medication Sig Dispense Refill  . B-D SYRINGE LUER SLIP TIP 3CC 3 ML MISC Inject 1 each as directed every 14 (fourteen) days. 30 each 11  . fluticasone (FLONASE) 50 MCG/ACT nasal spray instill 1 spray into each nostril once daily 16 g 11  . loratadine (CLARITIN) 10 MG tablet Take 1 tablet (10 mg total) by mouth as needed. 30 tablet 11  . testosterone cypionate (DEPOTESTOSTERONE CYPIONATE) 200 MG/ML injection INJECT 1 AND 1/4 INTRAMUSCULAR EVERY 2 WEEKS 10 mL 2   . valACYclovir (VALTREX) 1000 MG tablet take 1 tablet by mouth if needed 30 tablet 11  . azithromycin (ZITHROMAX) 250 MG tablet 2 today then 1 a day for 4 days 6 tablet 1  . guaiFENesin-codeine (ROBITUSSIN AC) 100-10 MG/5ML syrup Take 5 mLs by mouth 3 (three) times daily as needed for cough. 150 mL 0  . ibuprofen (ADVIL,MOTRIN) 800 MG tablet Take 1 tablet (800 mg total) by mouth every 8 (eight) hours as needed. 30 tablet 11   No facility-administered medications prior to visit.     Review of Systems  Constitutional: Negative for chills, fever, malaise/fatigue and weight loss.  HENT: Negative for ear discharge, ear pain and sore throat.   Eyes: Negative for blurred vision.  Respiratory: Negative for cough, sputum production, shortness of breath and wheezing.   Cardiovascular: Negative for chest pain, palpitations and leg swelling.  Gastrointestinal: Negative for abdominal pain, blood in stool, constipation, diarrhea, heartburn, melena and nausea.  Genitourinary: Negative for dysuria, frequency, hematuria and urgency.  Musculoskeletal: Negative for back pain, joint pain, myalgias and neck pain.  Skin: Negative for rash.  Neurological: Negative for dizziness, tingling, sensory change, focal weakness and headaches.  Endo/Heme/Allergies: Negative for environmental allergies  and polydipsia. Does not bruise/bleed easily.  Psychiatric/Behavioral: Negative for depression and suicidal ideas. The patient is not nervous/anxious and does not have insomnia.      Objective  Vitals:   05/01/17 0833  BP: 102/70  Pulse: 72  Weight: 162 lb (73.5 kg)  Height: 5\' 4"  (1.626 m)    Physical Exam  Constitutional: He is oriented to person, place, and time and well-developed, well-nourished, and in no distress.  HENT:  Head: Normocephalic.  Right Ear: External ear normal.  Left Ear: External ear normal.  Nose: Nose normal.  Mouth/Throat: Oropharynx is clear and moist.  Eyes: Conjunctivae and EOM are  normal. Pupils are equal, round, and reactive to light. Right eye exhibits no discharge. Left eye exhibits no discharge. No scleral icterus.  Neck: Normal range of motion. Neck supple. No JVD present. No tracheal deviation present. No thyromegaly present.  Cardiovascular: Normal rate, regular rhythm, normal heart sounds and intact distal pulses.  Exam reveals no gallop and no friction rub.   No murmur heard. Pulmonary/Chest: Breath sounds normal. No respiratory distress. He has no wheezes. He has no rales.  Abdominal: Soft. Bowel sounds are normal. He exhibits no mass. There is no hepatosplenomegaly. There is no tenderness. There is no rebound, no guarding and no CVA tenderness.  Musculoskeletal: Normal range of motion. He exhibits no edema or tenderness.  Lymphadenopathy:    He has no cervical adenopathy.  Neurological: He is alert and oriented to person, place, and time. He has normal sensation, normal strength, normal reflexes and intact cranial nerves. No cranial nerve deficit.  Skin: Skin is warm. No rash noted.  Psychiatric: Mood and affect normal.      Assessment & Plan  Problem List Items Addressed This Visit      Endocrine   Hypogonadism male     Other   Mixed hyperlipidemia   Relevant Orders   Lipid Profile   Taking medication for chronic disease   Relevant Orders   Hemoglobin   Renal Function Panel    Other Visit Diagnoses    Other viral warts    -  Primary   Relevant Medications   doxycycline (VIBRA-TABS) 100 MG tablet   Other Relevant Orders   Ambulatory referral to Dermatology      Meds ordered this encounter  Medications  . doxycycline (VIBRA-TABS) 100 MG tablet    Sig: Take 1 tablet (100 mg total) by mouth 2 (two) times daily.    Dispense:  20 tablet    Refill:  0      Dr. Hayden Rasmusseneanna Jones [facility]  05/01/17

## 2017-05-01 NOTE — Patient Instructions (Signed)
Warts Warts are small growths on the skin. They are common and can occur on various areas of the body. A person may have one wart or multiple warts. Most warts are not painful, and they usually do not cause problems. However, warts can cause pain if they are large or occur in an area of the body where pressure will be applied to them, such as the bottom of the foot. In many cases, warts do not require treatment. They usually go away on their own over a period of many months to a couple years. Various treatments may be done for warts that cause problems or do not go away. Sometimes, warts go away and then come back again. What are the causes? Warts are caused by a type of virus that is called human papillomavirus (HPV). This virus can spread from person to person through direct contact. Warts can also spread to other areas of the body when a person scratches a wart and then scratches another area of his or her body. What increases the risk? Warts are more likely to develop in:  People who are 10-20 years of age.  People who have a weakened body defense system (immune system).  What are the signs or symptoms? A wart may be round or oval or have an irregular shape. Most warts have a rough surface. Warts may range in color from skin color to light yellow, brown, or gray. They are generally less than  inch (1.3 cm) in size. Most warts are painless, but some can be painful when pressure is applied to them. How is this diagnosed? A wart can usually be diagnosed from its appearance. In some cases, a tissue sample may be removed (biopsy) to be looked at under a microscope. How is this treated? In many cases, warts do not need treatment. If treatment is needed, options may include:  Applying medicated solutions, creams, or patches to the wart. These may be over-the-counter or prescription medicines that make the skin soft so that layers will gradually shed away. In many cases, the medicine is applied one  or two times per day and covered with a bandage.  Putting duct tape over the top of the wart (occlusion). You will leave the tape in place for as long as told by your health care provider, then you will replace it with a new strip of tape. This is done until the wart goes away.  Freezing the wart with liquid nitrogen (cryotherapy).  Burning the wart with: ? Laser treatment. ? An electrified probe (electrocautery).  Injection of a medicine (Candida antigen) into the wart to help the body's immune system to fight off the wart.  Surgery to remove the wart.  Follow these instructions at home:  Apply over-the-counter and prescription medicines only as told by your health care provider.  Do not apply over-the-counter wart medicines to your face or genitals before you ask your health care provider if it is okay to do so.  Do not scratch or pick at a wart.  Wash your hands after you touch a wart.  Avoid shaving hair that is over a wart.  Keep all follow-up visits as told by your health care provider. This is important. Contact a health care provider if:  Your warts do not improve after treatment.  You have redness, swelling, or pain at the site of a wart.  You have bleeding from a wart that does not stop with light pressure.  You have diabetes and you develop a   wart. This information is not intended to replace advice given to you by your health care provider. Make sure you discuss any questions you have with your health care provider. Document Released: 08/28/2005 Document Revised: 05/01/2016 Document Reviewed: 02/13/2015 Elsevier Interactive Patient Education  2018 [employer]  

## 2017-05-02 LAB — HEMOGLOBIN: Hemoglobin: 16.9 g/dL (ref 13.0–17.7)

## 2017-05-02 LAB — RENAL FUNCTION PANEL
Albumin: 5 g/dL (ref 3.5–5.5)
BUN / CREAT RATIO: 8 — AB (ref 9–20)
BUN: 9 mg/dL (ref 6–20)
CHLORIDE: 101 mmol/L (ref 96–106)
CO2: 26 mmol/L (ref 18–29)
Calcium: 9.7 mg/dL (ref 8.7–10.2)
Creatinine, Ser: 1.12 mg/dL (ref 0.76–1.27)
GFR calc non Af Amer: 83 mL/min/{1.73_m2} (ref >59)
GFR, EST AFRICAN AMERICAN: 96 mL/min/{1.73_m2} (ref >59)
GLUCOSE: 87 mg/dL (ref 65–99)
POTASSIUM: 4.6 mmol/L (ref 3.5–5.2)
Phosphorus: 3.1 mg/dL (ref 2.5–4.5)
Sodium: 141 mmol/L (ref 134–144)

## 2017-05-02 LAB — LIPID PANEL
CHOL/HDL RATIO: 5.8 ratio — AB (ref 0.0–5.0)
CHOLESTEROL TOTAL: 236 mg/dL — AB (ref 100–199)
HDL: 41 mg/dL (ref >39)
LDL CALC: 159 mg/dL — AB (ref 0–99)
Triglycerides: 179 mg/dL — ABNORMAL HIGH (ref 0–149)
VLDL Cholesterol Cal: 36 mg/dL (ref 5–40)

## 2017-05-15 ENCOUNTER — Encounter: Payer: Self-pay | Admitting: Family Medicine

## 2017-05-21 ENCOUNTER — Encounter: Payer: Self-pay | Admitting: Family Medicine

## 2017-07-02 ENCOUNTER — Other Ambulatory Visit: Payer: Self-pay

## 2017-07-31 ENCOUNTER — Other Ambulatory Visit: Payer: Self-pay | Admitting: Family Medicine

## 2017-07-31 DIAGNOSIS — B349 Viral infection, unspecified: Secondary | ICD-10-CM

## 2017-10-01 ENCOUNTER — Other Ambulatory Visit: Payer: Self-pay

## 2017-10-09 ENCOUNTER — Encounter: Payer: Self-pay | Admitting: Family Medicine

## 2017-10-09 ENCOUNTER — Ambulatory Visit: Payer: 59 | Admitting: Family Medicine

## 2017-10-09 VITALS — BP 128/80 | HR 60 | Ht 64.0 in | Wt 170.0 lb

## 2017-10-09 DIAGNOSIS — Z79890 Hormone replacement therapy (postmenopausal): Secondary | ICD-10-CM | POA: Diagnosis not present

## 2017-10-09 DIAGNOSIS — B349 Viral infection, unspecified: Secondary | ICD-10-CM

## 2017-10-09 DIAGNOSIS — E291 Testicular hypofunction: Secondary | ICD-10-CM | POA: Diagnosis not present

## 2017-10-09 MED ORDER — TESTOSTERONE CYPIONATE 200 MG/ML IM SOLN: INTRAMUSCULAR | 2 refills | Status: DC

## 2017-10-09 MED ORDER — VALACYCLOVIR HCL 1 G PO TABS: ORAL_TABLET | ORAL | 11 refills | Status: DC

## 2017-10-09 MED ORDER — BD SYRINGE LUER SLIP TIP 3 ML MISC: 1.0000 | 11 refills | Status: DC

## 2017-10-09 NOTE — Progress Notes (Signed)
Name: Rick Mason   MRN: [id number]    DOB: 02/15/1976   Date:10/09/2017       Progress Note  Subjective  Chief Complaint  Chief Complaint  Patient presents with  . Allergic Rhinitis   . hormone replacement therapy  . Mouth Lesions    takes valacyclovir as needed    Patient presents for refill of hormone replacement.    Mouth Lesions   The onset was sudden. The problem occurs occasionally. Progression since onset: waxes and wanes. The problem is moderate. The symptoms are relieved by one or more OTC medications. Associated symptoms include mouth sores. Pertinent negatives include no decreased vision, no double vision, no eye itching, no photophobia, no congestion, no ear discharge, no ear pain, no headaches, no hearing loss, no rhinorrhea, no sore throat, no stridor, no swollen glands, no eye discharge, no eye pain and no eye redness.    No problem-specific Assessment & Plan notes found for this encounter.   Past Medical History:  Diagnosis Date  . Allergy   . Hypogonadism in male     Past Surgical History:  Procedure Laterality Date  . MASTECTOMY Bilateral     Family History  Problem Relation Age of Onset  . Heart disease Father   . Cancer Maternal Grandfather   . Heart disease Maternal Grandfather   . Cancer Paternal Grandmother   . Cancer Paternal Grandfather     Social History   Socioeconomic History  . Marital status: Single    Spouse name: Not on file  . Number of children: Not on file  . Years of education: Not on file  . Highest education level: Not on file  Social Needs  . Financial resource strain: Not on file  . Food insecurity - worry: Not on file  . Food insecurity - inability: Not on file  . Transportation needs - medical: Not on file  . Transportation needs - non-medical: Not on file  Occupational History  . Not on file  Tobacco Use  . Smoking status: Former Smoker    Last attempt to quit: 01/21/2015    Years since quitting: 2.7  .  Smokeless tobacco: Never Used  Substance and Sexual Activity  . Alcohol use: No    Alcohol/week: 0.0 oz  . Drug use: No  . Sexual activity: Yes  Other Topics Concern  . Not on file  Social History Narrative  . Not on file    Allergies  Allergen Reactions  . Penicillins     Outpatient Medications Prior to Visit  Medication Sig Dispense Refill  . fluticasone (FLONASE) 50 MCG/ACT nasal spray instill 1 spray into each nostril once daily 16 g 11  . loratadine (CLARITIN) 10 MG tablet Take 1 tablet (10 mg total) by mouth as needed. 30 tablet 11  . B-D SYRINGE LUER SLIP TIP 3CC 3 ML MISC Inject 1 each as directed every 14 (fourteen) days. 30 each 11  . testosterone cypionate (DEPOTESTOSTERONE CYPIONATE) 200 MG/ML injection INJECT 1 AND 1/4 INTRAMUSCULAR EVERY 2 WEEKS 10 mL 2  . valACYclovir (VALTREX) 1000 MG tablet take 1 tablet by mouth if needed 30 tablet 11  . doxycycline (VIBRA-TABS) 100 MG tablet Take 1 tablet (100 mg total) by mouth 2 (two) times daily. 20 tablet 0   No facility-administered medications prior to visit.     Review of Systems  HENT: Positive for mouth sores. Negative for congestion, ear discharge, ear pain, hearing loss, rhinorrhea and sore throat.   Eyes: Negative  for double vision, photophobia, pain, discharge, redness and itching.  Respiratory: Negative for stridor.   Neurological: Negative for headaches.     Objective  Vitals:   10/09/17 0956  BP: 128/80  Pulse: 60  Weight: 170 lb (77.1 kg)  Height: 5\' 4"  (1.626 m)    Physical Exam  Constitutional: He is oriented to person, place, and time and well-developed, well-nourished, and in no distress.  HENT:  Head: Normocephalic.  Right Ear: External ear normal.  Left Ear: External ear normal.  Nose: Nose normal.  Mouth/Throat: Oropharynx is clear and moist.  Eyes: Conjunctivae and EOM are normal. Pupils are equal, round, and reactive to light. Right eye exhibits no discharge. Left eye exhibits no  discharge. No scleral icterus.  Neck: Normal range of motion. Neck supple. No JVD present. No tracheal deviation present. No thyromegaly present.  Cardiovascular: Normal rate, regular rhythm, normal heart sounds and intact distal pulses. Exam reveals no gallop and no friction rub.  No murmur heard. Pulmonary/Chest: Breath sounds normal. No respiratory distress. He has no wheezes. He has no rales.  Abdominal: Soft. Bowel sounds are normal. He exhibits no mass. There is no hepatosplenomegaly. There is no tenderness. There is no rebound, no guarding and no CVA tenderness.  Musculoskeletal: Normal range of motion. He exhibits no edema or tenderness.  Lymphadenopathy:    He has no cervical adenopathy.  Neurological: He is alert and oriented to person, place, and time. He has normal sensation, normal strength and intact cranial nerves. No cranial nerve deficit.  Skin: Skin is warm. No rash noted.  Psychiatric: Mood and affect normal.  Nursing note and vitals reviewed.     Assessment & Plan  Problem List Items Addressed This Visit      Endocrine   Hypogonadism male - Primary    Other Visit Diagnoses    Hormone replacement therapy (HRT)       Relevant Medications   B-D SYRINGE LUER SLIP TIP 3CC 3 ML MISC   testosterone cypionate (DEPOTESTOSTERONE CYPIONATE) 200 MG/ML injection   Viral syndrome       Relevant Medications   valACYclovir (VALTREX) 1000 MG tablet      Meds ordered this encounter  Medications  . B-D SYRINGE LUER SLIP TIP 3CC 3 ML MISC    Sig: Inject 1 each every 14 (fourteen) days as directed.    Dispense:  30 each    Refill:  11  . testosterone cypionate (DEPOTESTOSTERONE CYPIONATE) 200 MG/ML injection    Sig: INJECT 1 AND 1/4 INTRAMUSCULAR EVERY 2 WEEKS    Dispense:  10 mL    Refill:  2  . valACYclovir (VALTREX) 1000 MG tablet    Sig: take 1 tablet by mouth if needed    Dispense:  30 tablet    Refill:  11      Dr. Hayden Rasmusseneanna [facility]  10/09/17

## 2017-10-17 ENCOUNTER — Other Ambulatory Visit: Payer: Self-pay

## 2018-04-10 ENCOUNTER — Other Ambulatory Visit: Payer: Self-pay

## 2018-04-10 ENCOUNTER — Encounter: Payer: Self-pay | Admitting: Anesthesiology

## 2018-04-10 ENCOUNTER — Other Ambulatory Visit: Payer: Self-pay | Admitting: Family Medicine

## 2018-04-10 DIAGNOSIS — Z79890 Hormone replacement therapy (postmenopausal): Secondary | ICD-10-CM

## 2018-04-10 NOTE — Discharge Instructions (Signed)
[facility] °[facility] °ENDOSCOPIC SINUS SURGERY °[facility] ° °What is Functional Endoscopic Sinus Surgery? ° The Surgery involves making the natural openings of the sinuses larger by removing the bony partitions that separate the sinuses from the nasal cavity.  The natural sinus lining is preserved as much as possible to allow the sinuses to resume normal function after the surgery.  In some patients nasal polyps (excessively swollen lining of the sinuses) may be removed to relieve obstruction of the sinus openings.  The surgery is performed through the nose using lighted scopes, which eliminates the need for incisions on the face.  A septoplasty is a different procedure which is sometimes performed with sinus surgery.  It involves straightening the boy partition that separates the two sides of your nose.  A crooked or deviated septum may need repair if is obstructing the sinuses or nasal airflow.  Turbinate reduction is also often performed during sinus surgery.  The turbinates are bony proturberances from the side walls of the nose which swell and can obstruct the nose in patients with sinus and allergy problems.  Their size can be surgically reduced to help relieve nasal obstruction. ° °What Can Sinus Surgery Do For Me? ° Sinus surgery can reduce the frequency of sinus infections requiring antibiotic treatment.  This can provide improvement in nasal congestion, post-nasal drainage, facial pressure and nasal obstruction.  Surgery will NOT prevent you from ever having an infection again, so it usually only for patients who get infections 4 or more times yearly requiring antibiotics, or for infections that do not clear with antibiotics.  It will not cure nasal allergies, so patients with allergies may still require medication to treat their allergies after surgery. Surgery may improve headaches related to sinusitis, however, some people will continue to  require medication to control sinus headaches related to allergies.  Surgery will do nothing for other forms of headache (migraine, tension or cluster). ° °What Are the Risks of Endoscopic Sinus Surgery? ° Current techniques allow surgery to be performed safely with little risk, however, there are rare complications that patients should be aware of.  Because the sinuses are located around the eyes, there is risk of eye injury, including blindness, though again, this would be quite rare. This is usually a result of bleeding behind the eye during surgery, which puts the vision oat risk, though there are treatments to protect the vision and prevent permanent disrupted by surgery causing a leak of the spinal fluid that surrounds the brain.  More serious complications would include bleeding inside the brain cavity or damage to the brain.  Again, all of these complications are uncommon, and spinal fluid leaks can be safely managed surgically if they occur.  The most common complication of sinus surgery is bleeding from the nose, which may require packing or cauterization of the nose.  Continued sinus have polyps may experience recurrence of the polyps requiring revision surgery.  Alterations of sense of smell or injury to the tear ducts are also rare complications.  ° °What is the Surgery Like, and what is the Recovery? ° The Surgery usually takes a couple of hours to perform, and is usually performed under a general anesthetic (completely asleep).  Patients are usually discharged home after a couple of hours.  Sometimes during surgery it is necessary to pack the nose to control bleeding, and the packing is left in place for 24 - 48 hours, and removed by your surgeon.    If a septoplasty was performed during the procedure, there is often a splint placed which must be removed after 5-7 days.   °Discomfort: Pain is usually mild to moderate, and can be controlled by prescription pain medication or acetaminophen (Tylenol).   Aspirin, Ibuprofen (Advil, Motrin), or Naprosyn (Aleve) should be avoided, as they can cause increased bleeding.  Most patients feel sinus pressure like they have a bad head cold for several days.  Sleeping with your head elevated can help reduce swelling and facial pressure, as can ice packs over the face.  A humidifier may be helpful to keep the mucous and blood from drying in the nose.  ° °Diet: There are no specific diet restrictions, however, you should generally start with clear liquids and a light diet of bland foods because the anesthetic can cause some nausea.  Advance your diet depending on how your stomach feels.  Taking your pain medication with food will often help reduce stomach upset which pain medications can cause. ° °Nasal Saline Irrigation: It is important to remove blood clots and dried mucous from the nose as it is healing.  This is done by having you irrigate the nose at least 3 - 4 times daily with a salt water solution.  We recommend using NeilMed Sinus Rinse (available at the drug store).  Fill the squeeze bottle with the solution, bend over a sink, and insert the tip of the squeeze bottle into the nose ½ of an inch.  Point the tip of the squeeze bottle towards the inside corner of the eye on the same side your irrigating.  Squeeze the bottle and gently irrigate the nose.  If you bend forward as you do this, most of the fluid will flow back out of the nose, instead of down your throat.   The solution should be warm, near body temperature, when you irrigate.   Each time you irrigate, you should use a full squeeze bottle.  ° °Note that if you are instructed to use Nasal Steroid Sprays at any time after your surgery, irrigate with saline BEFORE using the steroid spray, so you do not wash it all out of the nose. °Another product, Nasal Saline Gel (such as AYR Nasal Saline Gel) can be applied in each nostril 3 - 4 times daily to moisture the nose and reduce scabbing or crusting. ° °Bleeding:   Bloody drainage from the nose can be expected for several days, and patients are instructed to irrigate their nose frequently with salt water to help remove mucous and blood clots.  The drainage may be dark red or brown, though some fresh blood may be seen intermittently, especially after irrigation.  Do not blow you nose, as bleeding may occur. If you must sneeze, keep your mouth open to allow air to escape through your mouth. ° °If heavy bleeding occurs: Irrigate the nose with saline to rinse out clots, then spray the nose 3 - 4 times with Afrin Nasal Decongestant Spray.  The spray will constrict the blood vessels to slow bleeding.  Pinch the lower half of your nose shut to apply pressure, and lay down with your head elevated.  Ice packs over the nose may help as well. If bleeding persists despite these measures, you should notify your doctor.  Do not use the Afrin routinely to control nasal congestion after surgery, as it can result in worsening congestion and may affect healing.  ° ° ° °Activity: Return to work varies among patients. Most patients will be   out of work at least 5 - 7 days to recover.  Patient may return to work after they are off of narcotic pain medication, and feeling well enough to perform the functions of their job.  Patients must avoid heavy lifting (over 10 pounds) or strenuous physical for 2 weeks after surgery, so your employer may need to assign you to light duty, or keep you out of work longer if light duty is not possible.  NOTE: you should not drive, operate dangerous machinery, do any mentally demanding tasks or make any important legal or financial decisions while on narcotic pain medication and recovering from the general anesthetic.  °  °Call Your Doctor Immediately if You Have Any of the Following: °1. Bleeding that you cannot control with the above measures °2. Loss of vision, double vision, bulging of the eye or black eyes. °3. Fever over 101 degrees °4. Neck stiffness with  severe headache, fever, nausea and change in mental state. °You are always encourage to call anytime with concerns, however, please call with requests for pain medication refills during office hours. ° °Office Endoscopy: During follow-up visits your doctor will remove any packing or splints that may have been placed and evaluate and clean your sinuses endoscopically.  Topical anesthetic will be used to make this as comfortable as possible, though you may want to take your pain medication prior to the visit.  How often this will need to be done varies from patient to patient.  After complete recovery from the surgery, you may need follow-up endoscopy from time to time, particularly if there is concern of recurrent infection or nasal polyps. ° ° °General Anesthesia, Adult, Care After °These instructions provide you with information about caring for yourself after your procedure. Your health care provider may also give you more specific instructions. Your treatment has been planned according to current medical practices, but problems sometimes occur. Call your health care provider if you have any problems or questions after your procedure. °What can I expect after the procedure? °After the procedure, it is common to have: °· Vomiting. °· A sore throat. °· Mental slowness. ° °It is common to feel: °· Nauseous. °· Cold or shivery. °· Sleepy. °· Tired. °· Sore or achy, even in parts of your body where you did not have surgery. ° °Follow these instructions at home: °For at least 24 hours after the procedure: °· Do not: °? Participate in activities where you could fall or become injured. °? Drive. °? Use heavy machinery. °? Drink alcohol. °? Take sleeping pills or medicines that cause drowsiness. °? Make important decisions or sign legal documents. °? Take care of children on your own. °· Rest. °Eating and drinking °· If you vomit, drink water, juice, or soup when you can drink without vomiting. °· Drink enough fluid to  keep your urine clear or pale yellow. °· Make sure you have little or no nausea before eating solid foods. °· Follow the diet recommended by your health care provider. °General instructions °· Have a responsible adult stay with you until you are awake and alert. °· Return to your normal activities as told by your health care provider. Ask your health care provider what activities are safe for you. °· Take over-the-counter and prescription medicines only as told by your health care provider. °· If you smoke, do not smoke without supervision. °· Keep all follow-up visits as told by your health care provider. This is important. °Contact a health care provider if: °· You   continue to have nausea or vomiting at home, and medicines are not helpful. °· You cannot drink fluids or start eating again. °· You cannot urinate after 8-12 hours. °· You develop a skin rash. °· You have fever. °· You have increasing redness at the site of your procedure. °Get help right away if: °· You have difficulty breathing. °· You have chest pain. °· You have unexpected bleeding. °· You feel that you are having a life-threatening or urgent problem. °This information is not intended to replace advice given to you by your health care provider. Make sure you discuss any questions you have with your health care provider. °Document Released: 02/24/2001 Document Revised: 04/22/2016 Document Reviewed: 11/02/2015 °[employer] Interactive Patient Education © 2018 [employer] ° °

## 2018-04-14 ENCOUNTER — Ambulatory Visit: Payer: Self-pay | Admitting: Family Medicine

## 2018-04-16 ENCOUNTER — Ambulatory Visit: Admission: RE | Admit: 2018-04-16 | Payer: 59 | Source: Ambulatory Visit | Admitting: Otolaryngology

## 2018-04-16 HISTORY — DX: Cardiac murmur, unspecified: R01.1

## 2018-04-16 SURGERY — SEPTOPLASTY, NOSE, WITH NASAL TURBINATE REDUCTION
Anesthesia: General | Laterality: Right

## 2018-04-20 ENCOUNTER — Ambulatory Visit: Payer: 59 | Admitting: Family Medicine

## 2018-04-20 ENCOUNTER — Encounter: Payer: Self-pay | Admitting: Family Medicine

## 2018-04-20 VITALS — BP 110/70 | HR 80 | Ht 64.0 in | Wt 167.0 lb

## 2018-04-20 DIAGNOSIS — K121 Other forms of stomatitis: Secondary | ICD-10-CM

## 2018-04-20 DIAGNOSIS — B349 Viral infection, unspecified: Secondary | ICD-10-CM | POA: Diagnosis not present

## 2018-04-20 DIAGNOSIS — Z79890 Hormone replacement therapy (postmenopausal): Secondary | ICD-10-CM | POA: Diagnosis not present

## 2018-04-20 DIAGNOSIS — M5412 Radiculopathy, cervical region: Secondary | ICD-10-CM | POA: Diagnosis not present

## 2018-04-20 MED ORDER — PREDNISONE 10 MG PO TABS: 10.0000 mg | ORAL_TABLET | Freq: Every day | ORAL | 0 refills | Status: DC

## 2018-04-20 MED ORDER — BD SYRINGE LUER SLIP TIP 3 ML MISC
1.0000 | 11 refills | Status: DC
Start: 2018-04-20 — End: 2018-12-08

## 2018-04-20 MED ORDER — VALACYCLOVIR HCL 1 G PO TABS: ORAL_TABLET | ORAL | 11 refills | Status: DC

## 2018-04-20 MED ORDER — TESTOSTERONE CYPIONATE 200 MG/ML IM SOLN: INTRAMUSCULAR | 2 refills | Status: DC

## 2018-04-20 NOTE — Progress Notes (Signed)
Name: Rick Mason   MRN: [id number]    DOB: June 23, 1976   Date:04/20/2018       Progress Note  Subjective  Chief Complaint  Chief Complaint  Patient presents with  . hormone replacement therapy  . Mouth Lesions    needs refill on valtrex- takes as needed for cold sores  . Extremity Weakness    pins and needles down R) arm/ thumb is numb    Patient is presenting for refills of hormone replacement for gender affirmation therapy.  Mouth Lesions   The current episode started more than 2 weeks ago. The onset was gradual. The problem occurs occasionally. The problem has been unchanged. The problem is mild. The symptoms are relieved by one or more prescription drugs. Associated symptoms include mouth sores and neck pain. Pertinent negatives include no fever, no decreased vision, no double vision, no eye itching, no photophobia, no abdominal pain, no constipation, no diarrhea, no nausea, no congestion, no ear discharge, no ear pain, no headaches, no hearing loss, no rhinorrhea, no sore throat, no stridor, no swollen glands, no cough, no wheezing, no rash, no eye discharge and no eye redness.  Neck Pain   This is a new problem. The current episode started 1 to 4 weeks ago (04/01/2018). The problem occurs daily. The problem has been waxing and waning. The pain is associated with lifting a heavy object. The pain is present in the right side. The quality of the pain is described as aching. Associated symptoms include numbness and tingling. Pertinent negatives include no chest pain, fever, headaches, leg pain, pain with swallowing, paresis, photophobia, syncope, trouble swallowing, visual change, weakness or weight loss. Associated symptoms comments: Right arm/radial distribution. The treatment provided moderate relief.    No problem-specific Assessment & Plan notes found for this encounter.   Past Medical History:  Diagnosis Date  . Allergy   . Heart murmur   . Hypogonadism in male     Past  Surgical History:  Procedure Laterality Date  . MASTECTOMY Bilateral     Family History  Problem Relation Age of Onset  . Heart disease Father   . Cancer Maternal Grandfather   . Heart disease Maternal Grandfather   . Cancer Paternal Grandmother   . Cancer Paternal Grandfather     Social History   Socioeconomic History  . Marital status: Single    Spouse name: Not on file  . Number of children: Not on file  . Years of education: Not on file  . Highest education level: Not on file  Occupational History  . Not on file  Social Needs  . Financial resource strain: Not on file  . Food insecurity:    Worry: Not on file    Inability: Not on file  . Transportation needs:    Medical: Not on file    Non-medical: Not on file  Tobacco Use  . Smoking status: Former Smoker    Last attempt to quit: 01/21/2015    Years since quitting: 3.2  . Smokeless tobacco: Never Used  Substance and Sexual Activity  . Alcohol use: No    Alcohol/week: 0.0 oz  . Drug use: No  . Sexual activity: Yes  Lifestyle  . Physical activity:    Days per week: Not on file    Minutes per session: Not on file  . Stress: Not on file  Relationships  . Social connections:    Talks on phone: Not on file    Gets together: Not on file  Attends religious service: Not on file    Active member of club or organization: Not on file    Attends meetings of clubs or organizations: Not on file    Relationship status: Not on file  . Intimate partner violence:    Fear of current or ex partner: Not on file    Emotionally abused: Not on file    Physically abused: Not on file    Forced sexual activity: Not on file  Other Topics Concern  . Not on file  Social History Narrative  . Not on file    Allergies  Allergen Reactions  . Penicillins     Outpatient Medications Prior to Visit  Medication Sig Dispense Refill  . cetirizine (ZYRTEC) 10 MG chewable tablet Chew 10 mg by mouth daily.    . Triamcinolone  Acetonide (NASACORT AQ NA) Place into the nose.    . B-D SYRINGE LUER SLIP TIP 3CC 3 ML MISC Inject 1 each every 14 (fourteen) days as directed. 30 each 11  . loratadine (CLARITIN) 10 MG tablet Take 1 tablet (10 mg total) by mouth as needed. 30 tablet 11  . testosterone cypionate (DEPOTESTOSTERONE CYPIONATE) 200 MG/ML injection INJECT 1 AND 1/4 INTRAMUSCULAR EVERY 2 WEEKS 10 mL 2  . valACYclovir (VALTREX) 1000 MG tablet take 1 tablet by mouth if needed 30 tablet 11  . fluticasone (FLONASE) 50 MCG/ACT nasal spray instill 1 spray into each nostril once daily (Patient not taking: Reported on 04/10/2018) 16 g 11   No facility-administered medications prior to visit.     Review of Systems  Constitutional: Negative for chills, fever, malaise/fatigue and weight loss.  HENT: Positive for mouth sores. Negative for congestion, ear discharge, ear pain, hearing loss, rhinorrhea, sore throat and trouble swallowing.   Eyes: Negative for blurred vision, double vision, photophobia, discharge, redness and itching.  Respiratory: Negative for cough, sputum production, shortness of breath, wheezing and stridor.   Cardiovascular: Negative for chest pain, palpitations, leg swelling and syncope.  Gastrointestinal: Negative for abdominal pain, blood in stool, constipation, diarrhea, heartburn, melena and nausea.  Genitourinary: Negative for dysuria, frequency, hematuria and urgency.  Musculoskeletal: Positive for neck pain. Negative for back pain, joint pain and myalgias.  Skin: Negative for rash.  Neurological: Positive for tingling, sensory change and numbness. Negative for dizziness, focal weakness, weakness and headaches.  Endo/Heme/Allergies: Negative for environmental allergies and polydipsia. Does not bruise/bleed easily.  Psychiatric/Behavioral: Negative for depression and suicidal ideas. The patient is not nervous/anxious and does not have insomnia.      Objective  Vitals:   04/20/18 1356  BP: 110/70   Pulse: 80  Weight: 167 lb (75.8 kg)  Height:  (1.626 m)    Physical Exam  Constitutional: He is oriented to person, place, and time.  HENT:  Head: Normocephalic.  Right Ear: External ear normal.  Left Ear: External ear normal.  Nose: Nose normal.  Mouth/Throat: Oropharynx is clear and moist.  Eyes: Pupils are equal, round, and reactive to light. Conjunctivae and EOM are normal. Right eye exhibits no discharge. Left eye exhibits no discharge. No scleral icterus.  Neck: Normal range of motion. Neck supple. No JVD present. No tracheal deviation present. No thyromegaly present.  Cardiovascular: Normal rate, regular rhythm, normal heart sounds and intact distal pulses. Exam reveals no gallop and no friction rub.  No murmur heard. Pulmonary/Chest: Breath sounds normal. No respiratory distress. He has no wheezes. He has no rales.  Abdominal: Soft. Bowel sounds are normal. He  exhibits no mass. There is no hepatosplenomegaly. There is no tenderness. There is no rebound, no guarding and no CVA tenderness.  Musculoskeletal: Normal range of motion. He exhibits no edema or tenderness.  Lymphadenopathy:    He has no cervical adenopathy.  Neurological: He is alert and oriented to person, place, and time. He has normal strength and normal reflexes. No cranial nerve deficit.  Skin: Skin is warm. No rash noted.  Nursing note and vitals reviewed.     Assessment & Plan  Problem List Items Addressed This Visit      Digestive   Stomatitis   Relevant Medications   valACYclovir (VALTREX) 1000 MG tablet     Nervous and Auditory   Cervical radiculopathy   Relevant Medications   predniSONE (DELTASONE) 10 MG tablet     Other   Hormone replacement therapy (HRT) - Primary   Relevant Medications   testosterone cypionate (DEPOTESTOSTERONE CYPIONATE) 200 MG/ML injection   B-D SYRINGE LUER SLIP TIP 3CC 3 ML MISC   Viral syndrome   Relevant Medications   valACYclovir (VALTREX) 1000 MG tablet       Meds ordered this encounter  Medications  . testosterone cypionate (DEPOTESTOSTERONE CYPIONATE) 200 MG/ML injection    Sig: INJECT 1 AND 1/4 INTRAMUSCULAR EVERY 2 WEEKS    Dispense:  10 mL    Refill:  2  . B-D SYRINGE LUER SLIP TIP 3CC 3 ML MISC    Sig: Inject 1 each as directed every 14 (fourteen) days.    Dispense:  30 each    Refill:  11  . predniSONE (DELTASONE) 10 MG tablet    Sig: Take 1 tablet (10 mg total) by mouth daily with breakfast.    Dispense:  30 tablet    Refill:  0  . valACYclovir (VALTREX) 1000 MG tablet    Sig: take 1 tablet by mouth if needed    Dispense:  30 tablet    Refill:  11  pt stable on maintenance meds- continue present dose    Dr. Hayden [facility]  04/20/18

## 2018-05-21 ENCOUNTER — Other Ambulatory Visit: Payer: Self-pay

## 2018-07-23 ENCOUNTER — Ambulatory Visit (INDEPENDENT_AMBULATORY_CARE_PROVIDER_SITE_OTHER): Payer: 59 | Admitting: Family Medicine

## 2018-07-23 ENCOUNTER — Ambulatory Visit
Admission: RE | Admit: 2018-07-23 | Discharge: 2018-07-23 | Disposition: A | Discharge status: Home / Self Care | Payer: 59 | Source: Ambulatory Visit | Attending: Family Medicine | Admitting: Family Medicine

## 2018-07-23 ENCOUNTER — Other Ambulatory Visit: Payer: Self-pay | Admitting: Family Medicine

## 2018-07-23 ENCOUNTER — Encounter: Payer: Self-pay | Admitting: Family Medicine

## 2018-07-23 VITALS — BP 100/60 | HR 76 | Ht 64.0 in | Wt 160.0 lb

## 2018-07-23 DIAGNOSIS — J320 Chronic maxillary sinusitis: Secondary | ICD-10-CM

## 2018-07-23 DIAGNOSIS — J302 Other seasonal allergic rhinitis: Secondary | ICD-10-CM

## 2018-07-23 MED ORDER — CETIRIZINE HCL 10 MG PO TABS: 10.0000 mg | ORAL_TABLET | Freq: Every day | ORAL | 2 refills | Status: DC

## 2018-07-23 MED ORDER — MOMETASONE FUROATE 50 MCG/ACT NA SUSP: 2.0000 | Freq: Every day | NASAL | 1 refills | Status: DC

## 2018-07-23 MED ORDER — CLINDAMYCIN HCL 300 MG PO CAPS: 300.0000 mg | ORAL_CAPSULE | Freq: Four times a day (QID) | ORAL | 0 refills | Status: DC

## 2018-07-23 NOTE — Progress Notes (Signed)
Name: Rick Mason   MRN: [id number]    DOB: 05/29/1976   Date:07/23/2018       Progress Note  Subjective  Chief Complaint  Chief Complaint  Patient presents with  . Sinusitis    has prednisone, but never took it.   . Allergic Rhinitis     wants Rx for Zyrtec printed out    Sinusitis  This is a new problem. The current episode started 1 to 4 weeks ago (may). The problem has been waxing and waning since onset. There has been no fever. The pain is mild. Associated symptoms include congestion, headaches and sinus pressure. Pertinent negatives include no chills, coughing, diaphoresis, ear pain, hoarse voice, neck pain, shortness of breath, sneezing, sore throat or swollen glands. (Associated with abcess tooth) Treatments tried: partial clindamycin /azith full course. The treatment provided mild relief.    No problem-specific Assessment & Plan notes found for this encounter.   Past Medical History:  Diagnosis Date  . Allergy   . Heart murmur   . Hypogonadism in male     Past Surgical History:  Procedure Laterality Date  . MASTECTOMY Bilateral     Family History  Problem Relation Age of Onset  . Heart disease Father   . Cancer Maternal Grandfather   . Heart disease Maternal Grandfather   . Cancer Paternal Grandmother   . Cancer Paternal Grandfather     Social History   Socioeconomic History  . Marital status: Single    Spouse name: Not on file  . Number of children: Not on file  . Years of education: Not on file  . Highest education level: Not on file  Occupational History  . Not on file  Social Needs  . Financial resource strain: Not on file  . Food insecurity:    Worry: Not on file    Inability: Not on file  . Transportation needs:    Medical: Not on file    Non-medical: Not on file  Tobacco Use  . Smoking status: Former Smoker    Last attempt to quit: 01/21/2015    Years since quitting: 3.5  . Smokeless tobacco: Never Used  Substance and Sexual Activity  .  Alcohol use: No    Alcohol/week: 0.0 standard drinks  . Drug use: No  . Sexual activity: Yes  Lifestyle  . Physical activity:    Days per week: Not on file    Minutes per session: Not on file  . Stress: Not on file  Relationships  . Social connections:    Talks on phone: Not on file    Gets together: Not on file    Attends religious service: Not on file    Active member of club or organization: Not on file    Attends meetings of clubs or organizations: Not on file    Relationship status: Not on file  . Intimate partner violence:    Fear of current or ex partner: Not on file    Emotionally abused: Not on file    Physically abused: Not on file    Forced sexual activity: Not on file  Other Topics Concern  . Not on file  Social History Narrative  . Not on file    Allergies  Allergen Reactions  . Penicillins     Outpatient Medications Prior to Visit  Medication Sig Dispense Refill  . B-D SYRINGE LUER SLIP TIP 3CC 3 ML MISC Inject 1 each as directed every 14 (fourteen) days. 30 each 11  .  testosterone cypionate (DEPOTESTOSTERONE CYPIONATE) 200 MG/ML injection INJECT 1 AND 1/4 INTRAMUSCULAR EVERY 2 WEEKS 10 mL 2  . Triamcinolone Acetonide (NASACORT AQ NA) Place into the nose.    . valACYclovir (VALTREX) 1000 MG tablet take 1 tablet by mouth if needed 30 tablet 11  . cetirizine (ZYRTEC) 10 MG chewable tablet Chew 10 mg by mouth daily.    . predniSONE (DELTASONE) 10 MG tablet Take 1 tablet (10 mg total) by mouth daily with breakfast. 30 tablet 0   No facility-administered medications prior to visit.     Review of Systems  Constitutional: Negative for chills, diaphoresis, fever, malaise/fatigue and weight loss.  HENT: Positive for congestion and sinus pressure. Negative for ear discharge, ear pain, hoarse voice, sneezing and sore throat.   Eyes: Negative for blurred vision.  Respiratory: Negative for cough, sputum production, shortness of breath and wheezing.   Cardiovascular:  Negative for chest pain, palpitations and leg swelling.  Gastrointestinal: Negative for abdominal pain, blood in stool, constipation, diarrhea, heartburn, melena and nausea.  Genitourinary: Negative for dysuria, frequency, hematuria and urgency.  Musculoskeletal: Negative for back pain, joint pain, myalgias and neck pain.  Skin: Negative for rash.  Neurological: Positive for headaches. Negative for dizziness, tingling, sensory change and focal weakness.  Endo/Heme/Allergies: Negative for environmental allergies and polydipsia. Does not bruise/bleed easily.  Psychiatric/Behavioral: Negative for depression and suicidal ideas. The patient is not nervous/anxious and does not have insomnia.      Objective  Vitals:   07/23/18 0918  BP: 100/60  Pulse: 76  Weight: 160 lb (72.6 kg)  Height: 5\' 4"  (1.626 m)    Physical Exam  Constitutional: He is oriented to person, place, and time.  HENT:  Head: Normocephalic.  Right Ear: Hearing, tympanic membrane, external ear and ear canal normal.  Left Ear: Hearing, tympanic membrane, external ear and ear canal normal.  Nose: Right sinus exhibits maxillary sinus tenderness. Right sinus exhibits no frontal sinus tenderness. Left sinus exhibits maxillary sinus tenderness. Left sinus exhibits no frontal sinus tenderness.  Mouth/Throat: Uvula is midline and oropharynx is clear and moist. No oropharyngeal exudate, posterior oropharyngeal edema or posterior oropharyngeal erythema.  Eyes: Pupils are equal, round, and reactive to light. Conjunctivae and EOM are normal. Right eye exhibits no discharge. Left eye exhibits no discharge. No scleral icterus.  Neck: Normal range of motion. Neck supple. No JVD present. No tracheal deviation present. No thyromegaly present.  Cardiovascular: Normal rate, regular rhythm, normal heart sounds and intact distal pulses. Exam reveals no gallop and no friction rub.  No murmur heard. Pulmonary/Chest: Breath sounds normal. No  respiratory distress. He has no wheezes. He has no rales.  Abdominal: Soft. Bowel sounds are normal. He exhibits no mass. There is no hepatosplenomegaly. There is no tenderness. There is no rebound, no guarding and no CVA tenderness.  Musculoskeletal: Normal range of motion. He exhibits no edema or tenderness.  Lymphadenopathy:    He has no cervical adenopathy.  Neurological: He is alert and oriented to person, place, and time. He has normal strength and normal reflexes. No cranial nerve deficit.  Skin: Skin is warm. No rash noted.  Nursing note and vitals reviewed.     Assessment & Plan  Problem List Items Addressed This Visit    None    Visit Diagnoses    Other seasonal allergic rhinitis    -  Primary   Chronic Presisten mucinex d/nasonex   Relevant Medications   cetirizine (ZYRTEC) 10 MG tablet  clindamycin (CLEOCIN) 300 MG capsule   mometasone (NASONEX) 50 MCG/ACT nasal spray   Chronic maxillary sinusitis       since May extensive dental surgery. unresolved on antibiotics. complete sinus views. Discussed with Dr Elenore Rota clindamycin 300mg  qid,mucinex d bid, nasonex,rec    Relevant Medications   cetirizine (ZYRTEC) 10 MG tablet   clindamycin (CLEOCIN) 300 MG capsule   mometasone (NASONEX) 50 MCG/ACT nasal spray      Meds ordered this encounter  Medications  . cetirizine (ZYRTEC) 10 MG tablet    Sig: Take 1 tablet (10 mg total) by mouth daily.    Dispense:  90 tablet    Refill:  2  . clindamycin (CLEOCIN) 300 MG capsule    Sig: Take 1 capsule (300 mg total) by mouth 4 (four) times daily.    Dispense:  40 capsule    Refill:  0  . mometasone (NASONEX) 50 MCG/ACT nasal spray    Sig: Place 2 sprays into the nose daily.    Dispense:  17 g    Refill:  1      Dr. [facility]  07/23/18

## 2018-08-10 ENCOUNTER — Ambulatory Visit
Admission: RE | Admit: 2018-08-10 | Discharge: 2018-08-10 | Disposition: A | Discharge status: Home / Self Care | Payer: 59 | Source: Ambulatory Visit | Attending: Otolaryngology | Admitting: Otolaryngology

## 2018-08-10 ENCOUNTER — Other Ambulatory Visit: Payer: Self-pay | Admitting: Otolaryngology

## 2018-08-10 DIAGNOSIS — J329 Chronic sinusitis, unspecified: Secondary | ICD-10-CM

## 2018-11-12 ENCOUNTER — Other Ambulatory Visit: Payer: Self-pay | Admitting: Family Medicine

## 2018-11-12 DIAGNOSIS — Z79890 Hormone replacement therapy (postmenopausal): Secondary | ICD-10-CM

## 2018-12-08 ENCOUNTER — Encounter: Payer: Self-pay | Admitting: Family Medicine

## 2018-12-08 ENCOUNTER — Ambulatory Visit: Payer: 59 | Admitting: Family Medicine

## 2018-12-08 VITALS — BP 100/64 | HR 76 | Ht 64.0 in | Wt 161.4 lb

## 2018-12-08 DIAGNOSIS — Z6827 Body mass index (BMI) 27.0-27.9, adult: Secondary | ICD-10-CM

## 2018-12-08 DIAGNOSIS — K121 Other forms of stomatitis: Secondary | ICD-10-CM | POA: Diagnosis not present

## 2018-12-08 DIAGNOSIS — B349 Viral infection, unspecified: Secondary | ICD-10-CM | POA: Diagnosis not present

## 2018-12-08 DIAGNOSIS — Z23 Encounter for immunization: Secondary | ICD-10-CM | POA: Diagnosis not present

## 2018-12-08 DIAGNOSIS — R69 Illness, unspecified: Secondary | ICD-10-CM

## 2018-12-08 DIAGNOSIS — Z79890 Hormone replacement therapy (postmenopausal): Secondary | ICD-10-CM

## 2018-12-08 MED ORDER — VALACYCLOVIR HCL 1 G PO TABS: ORAL_TABLET | ORAL | 11 refills | Status: DC

## 2018-12-08 MED ORDER — BD SYRINGE LUER SLIP TIP 3 ML MISC: 1.0000 | 11 refills | Status: DC

## 2018-12-08 MED ORDER — TESTOSTERONE CYPIONATE 200 MG/ML IM SOLN: INTRAMUSCULAR | 2 refills | Status: DC

## 2018-12-08 NOTE — Progress Notes (Signed)
Date:  12/08/2018   Name:  Rick Mason   DOB:  01/18/1976   MRN:  [id number]   Chief Complaint: hormone replacement therapy (needs refill on testostrone/ needs needles and syringes); viral syndrome (prescription for valacyclovir as needed); and tdap vacc  Patient is a 43 year old male who presents for a hormonal replace exam. The patient reports the following problems: none. Health maintenance has been reviewed tdap.   Thyroid Problem  Presents for follow-up (for testosterone replacement) visit. Patient reports no anxiety, cold intolerance, constipation, depressed mood, diaphoresis, diarrhea, dry skin, fatigue, hair loss, heat intolerance, hoarse voice, leg swelling, nail problem, palpitations, tremors, visual change, weight gain or weight loss. The symptoms have been stable.    Review of Systems  Constitutional: Negative for chills, diaphoresis, fatigue, fever, weight gain and weight loss.  HENT: Negative for drooling, ear discharge, ear pain, hoarse voice and sore throat.   Respiratory: Negative for cough, shortness of breath and wheezing.   Cardiovascular: Negative for chest pain, palpitations and leg swelling.  Gastrointestinal: Negative for abdominal pain, blood in stool, constipation, diarrhea and nausea.  Endocrine: Negative for cold intolerance, heat intolerance and polydipsia.  Genitourinary: Negative for dysuria, frequency, hematuria and urgency.  Musculoskeletal: Negative for back pain, myalgias and neck pain.  Skin: Negative for rash.  Allergic/Immunologic: Negative for environmental allergies.  Neurological: Negative for dizziness, tremors and headaches.  Hematological: Does not bruise/bleed easily.  Psychiatric/Behavioral: Negative for suicidal ideas. The patient is not nervous/anxious.     Patient Active Problem List   Diagnosis Date Noted  . Stomatitis 04/20/2018  . Cervical radiculopathy 04/20/2018  . Hormone replacement therapy (HRT) 04/20/2018  . Viral syndrome  04/20/2018  . Hypogonadism male 05/01/2017  . Mixed hyperlipidemia 05/01/2017  . Taking medication for chronic disease 05/01/2017    Allergies  Allergen Reactions  . Penicillins     Past Surgical History:  Procedure Laterality Date  . MASTECTOMY Bilateral     Social History   Tobacco Use  . Smoking status: Former Smoker    Last attempt to quit: 01/21/2015    Years since quitting: 3.8  . Smokeless tobacco: Never Used  Substance Use Topics  . Alcohol use: No    Alcohol/week: 0.0 standard drinks  . Drug use: No     Medication list has been reviewed and updated.  Current Meds  Medication Sig  . B-D SYRINGE LUER SLIP TIP 3CC 3 ML MISC Inject 1 each as directed every 14 (fourteen) days.  Marland Kitchen. testosterone cypionate (DEPOTESTOSTERONE CYPIONATE) 200 MG/ML injection INJECT 1 AND 1/4 INTRAMUSCULAR EVERY 2 WEEKS  . Triamcinolone Acetonide (NASACORT AQ NA) Place into the nose.  . valACYclovir (VALTREX) 1000 MG tablet take 1 tablet by mouth if needed    PHQ 2/9 Scores 04/20/2018 10/09/2017 01/05/2016  PHQ - 2 Score 0 0 0  PHQ- 9 Score 0 1 -    Physical Exam Vitals signs and nursing note reviewed.  HENT:     Head: Normocephalic.     Right Ear: External ear normal.     Left Ear: External ear normal.     Nose: Nose normal.  Eyes:     General: No scleral icterus.       Right eye: No discharge.        Left eye: No discharge.     Conjunctiva/sclera: Conjunctivae normal.     Pupils: Pupils are equal, round, and reactive to light.  Neck:     Musculoskeletal: Normal range  of motion and neck supple.     Thyroid: No thyromegaly.     Vascular: No JVD.     Trachea: No tracheal deviation.  Cardiovascular:     Rate and Rhythm: Normal rate and regular rhythm.     Heart sounds: Normal heart sounds. No murmur. No friction rub. No gallop.   Pulmonary:     Effort: No respiratory distress.     Breath sounds: Normal breath sounds. No wheezing or rales.  Abdominal:     General: Bowel sounds  are normal.     Palpations: Abdomen is soft. There is no mass.     Tenderness: There is no abdominal tenderness. There is no guarding or rebound.  Musculoskeletal: Normal range of motion.        General: No tenderness.  Lymphadenopathy:     Cervical: No cervical adenopathy.  Skin:    General: Skin is warm.     Findings: No rash.  Neurological:     Mental Status: He is alert and oriented to person, place, and time.     Cranial Nerves: No cranial nerve deficit.     Deep Tendon Reflexes: Reflexes are normal and symmetric.     BP 100/64   Pulse 76   Ht 5\' 4"  (1.626 m)   Wt 161 lb 6.4 oz (73.2 kg)   BMI 27.70 kg/m   Assessment and Plan: 1. Hormone replacement therapy (HRT) Chronic.  Stable.  Will continue testosterone cypionate 200 mg/mL.  1-1/4 cc every 2 weeks. - B-D SYRINGE LUER SLIP TIP 3CC 3 ML MISC; Inject 1 each as directed every 14 (fourteen) days.  Dispense: 30 each; Refill: 11 - testosterone cypionate (DEPOTESTOSTERONE CYPIONATE) 200 MG/ML injection; INJECT 1 AND 1/4 INTRAMUSCULAR EVERY 2 WEEKS  Dispense: 10 mL; Refill: 2 - Hemoglobin  2. BMI 27.0-27.9,adult Health risks of being over weight were discussed and patient was counseled on weight loss options and exercise.  3. Viral syndrome Has breakout from source requiring Valtrex 1 g as needed. - valACYclovir (VALTREX) 1000 MG tablet; take 1 tablet by mouth if needed  Dispense: 30 tablet; Refill: 11  4. Stomatitis Current.  Stable.  Continue Valtrex 1 g daily as needed - valACYclovir (VALTREX) 1000 MG tablet; take 1 tablet by mouth if needed  Dispense: 30 tablet; Refill: 11  5. Taking medication for chronic disease Patient on injectable cholesterol will check renal function panel and hemoglobin. - Lipid panel - Hemoglobin - Renal Function Panel  6. Need for Tdap vaccination Discussed and administered. - Tdap vaccine greater than or equal to 7yo IM

## 2018-12-09 LAB — RENAL FUNCTION PANEL
Albumin: 4.6 g/dL (ref 3.5–5.5)
BUN/Creatinine Ratio: 10 (ref 9–20)
BUN: 9 mg/dL (ref 6–24)
CO2: 23 mmol/L (ref 20–29)
CREATININE: 0.94 mg/dL (ref 0.76–1.27)
Calcium: 9.2 mg/dL (ref 8.7–10.2)
Chloride: 102 mmol/L (ref 96–106)
GFR calc Af Amer: 117 mL/min/{1.73_m2} (ref >59)
GFR calc non Af Amer: 101 mL/min/{1.73_m2} (ref >59)
Glucose: 62 mg/dL — ABNORMAL LOW (ref 65–99)
Phosphorus: 2.8 mg/dL (ref 2.5–4.5)
Potassium: 4.3 mmol/L (ref 3.5–5.2)
SODIUM: 141 mmol/L (ref 134–144)

## 2018-12-09 LAB — LIPID PANEL
Chol/HDL Ratio: 4.8 ratio (ref 0.0–5.0)
Cholesterol, Total: 211 mg/dL — ABNORMAL HIGH (ref 100–199)
HDL: 44 mg/dL (ref >39)
LDL Calculated: 141 mg/dL — ABNORMAL HIGH (ref 0–99)
Triglycerides: 128 mg/dL (ref 0–149)
VLDL Cholesterol Cal: 26 mg/dL (ref 5–40)

## 2018-12-09 LAB — HEMOGLOBIN: HEMOGLOBIN: 17.3 g/dL (ref 13.0–17.7)

## 2019-06-10 ENCOUNTER — Ambulatory Visit: Payer: 59 | Admitting: Family Medicine

## 2019-06-14 ENCOUNTER — Other Ambulatory Visit: Payer: Self-pay

## 2019-06-14 ENCOUNTER — Encounter: Payer: Self-pay | Admitting: Family Medicine

## 2019-06-14 ENCOUNTER — Ambulatory Visit: Payer: 59 | Admitting: Family Medicine

## 2019-06-14 VITALS — BP 120/76 | HR 64 | Ht 64.0 in | Wt 164.0 lb

## 2019-06-14 DIAGNOSIS — B360 Pityriasis versicolor: Secondary | ICD-10-CM | POA: Diagnosis not present

## 2019-06-14 DIAGNOSIS — R69 Illness, unspecified: Secondary | ICD-10-CM | POA: Diagnosis not present

## 2019-06-14 DIAGNOSIS — Z79890 Hormone replacement therapy (postmenopausal): Secondary | ICD-10-CM | POA: Diagnosis not present

## 2019-06-14 MED ORDER — KETOCONAZOLE 2 % EX CREA: 1.0000 "application " | TOPICAL_CREAM | Freq: Every day | CUTANEOUS | 0 refills | Status: DC

## 2019-06-14 MED ORDER — BD SYRINGE LUER SLIP TIP 3 ML MISC: 1.0000 | 11 refills | Status: DC

## 2019-06-14 MED ORDER — TESTOSTERONE CYPIONATE 200 MG/ML IM SOLN: 200.0000 mg | INTRAMUSCULAR | 2 refills | Status: DC

## 2019-06-14 NOTE — Patient Instructions (Signed)
Tinea Versicolor  Tinea versicolor is a common fungal infection of the skin. It causes a rash that appears as light or dark patches on the skin. The rash most often occurs on the chest, back, neck, or upper arms. This condition is more common during warm weather. Other than affecting how your skin looks, tinea versicolor usually does not cause other problems. In most cases, the infection goes away in a few weeks with treatment. It may take a few months for the patches on your skin to return to your usual skin color. What are the causes? This condition occurs when a type of fungus that is normally present on the skin starts to overgrow. This fungus is a kind of yeast. The exact cause of the overgrowth is not known. This condition cannot be passed from one person to another (it is not contagious). What increases the risk? This condition is more likely to develop when certain factors are present, such as:  Heat and humidity.  Sweating too much.  Hormone changes.  Oily skin.  A weak disease-fighting system (immunesystem). What are the signs or symptoms? Symptoms of this condition include:  A rash of light or dark patches on your skin. The rash may have: ? Patches of tan or pink spots (on light skin). ? Patches of white or brown spots (on dark skin). ? Patches of skin that do not tan. ? Well-marked edges. ? Scales on the discolored areas.  Mild itching. How is this diagnosed? A health care provider can usually diagnose this condition by looking at your skin. During the exam, he or she may use ultraviolet (UV) light to see how much of your skin has been affected. In some cases, a skin sample may be taken by scraping the rash. This sample will be viewed under a microscope to check for yeast overgrowth. How is this treated? Treatment for this condition may include:  Dandruff shampoo that is applied to the affected skin during showers or bathing.  Over-the-counter medicated skin cream,  lotion, or soaps.  Prescription antifungal medicine in the form of skin cream or pills.  Medicine to help reduce itching. Follow these instructions at home:  Take over-the-counter and prescription medicines only as told by your health care provider.  Apply dandruff shampoo to the affected area if your health care provider told you to do that. You may be instructed to scrub the affected skin for several minutes each day.  Do not scratch the affected area of skin.  Avoid hot and humid conditions.  Do not use tanning booths.  Try to avoid sweating a lot. Contact a health care provider if:  Your symptoms get worse.  You have a fever.  You have redness, swelling, or pain at the site of your rash.  You have fluid or blood coming from your rash.  Your rash feels warm to the touch.  You have pus or a bad smell coming from your rash.  Your rash returns (recurs) after treatment. Summary  Tinea versicolor is a common fungal infection of the skin. It causes a rash that appears as light or dark patches on the skin.  The rash most often occurs on the chest, back, neck, or upper arms.  A health care provider can usually diagnose this condition by looking at your skin.  Treatment may include applying shampoo to the skin and taking or applying medicines. This information is not intended to replace advice given to you by your health care provider. Make sure you   discuss any questions you have with your health care provider. Document Released: 11/15/2000 Document Revised: 10/31/2017 Document Reviewed: 07/22/2017 Elsevier Patient Education  2020 [employer]  

## 2019-06-14 NOTE — Progress Notes (Signed)
Date:  06/14/2019   Name:  Rick Mason   DOB:  05-28-1976   MRN:  [id number]   Chief Complaint: Allergic Rhinitis , hormone replacement therapy, viral syndrome, and Rash (on back/ tinea versicola? gets worse in the summertime)  Patient is a 43 year old male who presents for a hormone administration exam. The patient reports the following problems: rash. Health maintenance has been reviewed up to date.  Rash This is a recurrent problem. The current episode started 1 to 4 weeks ago. The affected locations include the back. The rash is characterized by swelling and redness. Pertinent negatives include no cough, diarrhea, fever, shortness of breath or sore throat. The treatment provided moderate relief.    Review of Systems  Constitutional: Negative for chills and fever.  HENT: Negative for drooling, ear discharge, ear pain and sore throat.   Respiratory: Negative for cough, shortness of breath and wheezing.   Cardiovascular: Negative for chest pain, palpitations and leg swelling.  Gastrointestinal: Negative for abdominal pain, blood in stool, constipation, diarrhea and nausea.  Endocrine: Negative for polydipsia.  Genitourinary: Negative for dysuria, frequency, hematuria and urgency.  Musculoskeletal: Negative for back pain, myalgias and neck pain.  Skin: Positive for rash.  Allergic/Immunologic: Negative for environmental allergies.  Neurological: Negative for dizziness and headaches.  Hematological: Does not bruise/bleed easily.  Psychiatric/Behavioral: Negative for suicidal ideas. The patient is not nervous/anxious.     Patient Active Problem List   Diagnosis Date Noted   Stomatitis 04/20/2018   Cervical radiculopathy 04/20/2018   Hormone replacement therapy (HRT) 04/20/2018   Viral syndrome 04/20/2018   Hypogonadism male 05/01/2017   Mixed hyperlipidemia 05/01/2017   Taking medication for chronic disease 05/01/2017    Allergies  Allergen Reactions   Penicillins      Past Surgical History:  Procedure Laterality Date   MASTECTOMY Bilateral     Social History   Tobacco Use   Smoking status: Former Smoker    Quit date: 01/21/2015    Years since quitting: 4.3   Smokeless tobacco: Never Used  Substance Use Topics   Alcohol use: No    Alcohol/week: 0.0 standard drinks   Drug use: No     Medication list has been reviewed and updated.  Current Meds  Medication Sig   B-D SYRINGE LUER SLIP TIP 3CC 3 ML MISC Inject 1 each as directed every 14 (fourteen) days.   cetirizine (ZYRTEC) 10 MG tablet Take 1 tablet (10 mg total) by mouth daily.   testosterone cypionate (DEPOTESTOSTERONE CYPIONATE) 200 MG/ML injection INJECT 1 AND 1/4 INTRAMUSCULAR EVERY 2 WEEKS (Patient taking differently: Inject 200 mg into the muscle. INJECT 1ML  INTRAMUSCULAR EVERY 2 WEEKS)   Triamcinolone Acetonide (NASACORT AQ NA) Place into the nose.   valACYclovir (VALTREX) 1000 MG tablet take 1 tablet by mouth if needed    PHQ 2/9 Scores 04/20/2018 10/09/2017 01/05/2016  PHQ - 2 Score 0 0 0  PHQ- 9 Score 0 1 -    BP Readings from Last 3 Encounters:  06/14/19 120/76  12/08/18 100/64  07/23/18 100/60    Physical Exam Vitals signs and nursing note reviewed.  HENT:     Head: Normocephalic.     Right Ear: Tympanic membrane, ear canal and external ear normal.     Left Ear: Tympanic membrane, ear canal and external ear normal.     Nose: Nose normal. No congestion or rhinorrhea.  Eyes:     General: No scleral icterus.  Right eye: No discharge.        Left eye: No discharge.     Conjunctiva/sclera: Conjunctivae normal.     Pupils: Pupils are equal, round, and reactive to light.  Neck:     Musculoskeletal: Normal range of motion and neck supple.     Thyroid: No thyromegaly.     Vascular: No JVD.     Trachea: No tracheal deviation.  Cardiovascular:     Rate and Rhythm: Normal rate and regular rhythm.     Heart sounds: Normal heart sounds. No murmur. No  friction rub. No gallop.   Pulmonary:     Effort: No respiratory distress.     Breath sounds: Normal breath sounds. No wheezing or rales.  Abdominal:     General: Bowel sounds are normal.     Palpations: Abdomen is soft. There is no mass.     Tenderness: There is no abdominal tenderness. There is no guarding or rebound.  Musculoskeletal: Normal range of motion.        General: No tenderness.  Lymphadenopathy:     Cervical: No cervical adenopathy.  Skin:    General: Skin is warm.     Findings: Erythema present. No rash.  Neurological:     Mental Status: He is alert and oriented to person, place, and time.     Cranial Nerves: No cranial nerve deficit.     Deep Tendon Reflexes: Reflexes are normal and symmetric.     Wt Readings from Last 3 Encounters:  06/14/19 164 lb (74.4 kg)  12/08/18 161 lb 6.4 oz (73.2 kg)  07/23/18 160 lb (72.6 kg)    BP 120/76    Pulse 64    Ht 5\' 4"  (1.626 m)    Wt 164 lb (74.4 kg)    BMI 28.15 kg/m   Assessment and Plan: 1. Hormone replacement therapy (HRT) Chronic.  Controlled.  Patient with gender affirmation hormone therapy will continue testosterone cypionate 200 milligrams once every 14 days.- B-D SYRINGE LUER SLIP TIP 3CC 3 ML MISC; Inject 1 each as directed every 14 (fourteen) days.  Dispense: 30 each; Refill: 11 - testosterone cypionate (DEPOTESTOSTERONE CYPIONATE) 200 MG/ML injection; Inject 1 mL (200 mg total) into the muscle every 14 (fourteen) days. INJECT 1ML  INTRAMUSCULAR EVERY 2 WEEKS  Dispense: 6 mL; Refill: 2  2. Taking medication for chronic disease Patient taking medication and will check for side effects including hemoglobin, lipid panel, and hepatic function. - Lipid Panel With LDL/HDL Ratio - Hepatic function panel - Hemoglobin  3. Tinea versicolor Recurrent.  Will refer to dermatology.  In the meantime patient will continue to use Nizoral and cream form. - Ambulatory referral to Dermatology

## 2019-06-15 LAB — HEPATIC FUNCTION PANEL
ALT: 24 IU/L (ref 0–44)
AST: 20 IU/L (ref 0–40)
Albumin: 5 g/dL (ref 4.0–5.0)
Alkaline Phosphatase: 59 IU/L (ref 39–117)
Bilirubin Total: 0.8 mg/dL (ref 0.0–1.2)
Bilirubin, Direct: 0.18 mg/dL (ref 0.00–0.40)
Total Protein: 7.7 g/dL (ref 6.0–8.5)

## 2019-06-15 LAB — LIPID PANEL WITH LDL/HDL RATIO
Cholesterol, Total: 233 mg/dL — ABNORMAL HIGH (ref 100–199)
HDL: 47 mg/dL (ref >39)
LDL Calculated: 161 mg/dL — ABNORMAL HIGH (ref 0–99)
LDl/HDL Ratio: 3.4 ratio (ref 0.0–3.6)
Triglycerides: 123 mg/dL (ref 0–149)
VLDL Cholesterol Cal: 25 mg/dL (ref 5–40)

## 2019-06-15 LAB — HEMOGLOBIN: Hemoglobin: 16.9 g/dL (ref 13.0–17.7)

## 2019-09-15 ENCOUNTER — Other Ambulatory Visit: Payer: Self-pay | Admitting: Family Medicine

## 2019-09-15 DIAGNOSIS — J302 Other seasonal allergic rhinitis: Secondary | ICD-10-CM

## 2019-09-16 ENCOUNTER — Ambulatory Visit: Payer: 59 | Admitting: Family Medicine

## 2019-12-15 ENCOUNTER — Ambulatory Visit: Payer: 59 | Admitting: Family Medicine

## 2019-12-16 IMAGING — CR DG SINUSES COMPLETE 3+V
4 series · 4 of 4 positions shown · non-contrast
Comparison: Sinus films of 07/23/2017

CLINICAL DATA: Chronic sinusitis

EXAM:
PARANASAL SINUSES - COMPLETE 3 + VIEW

[sinuses pa]
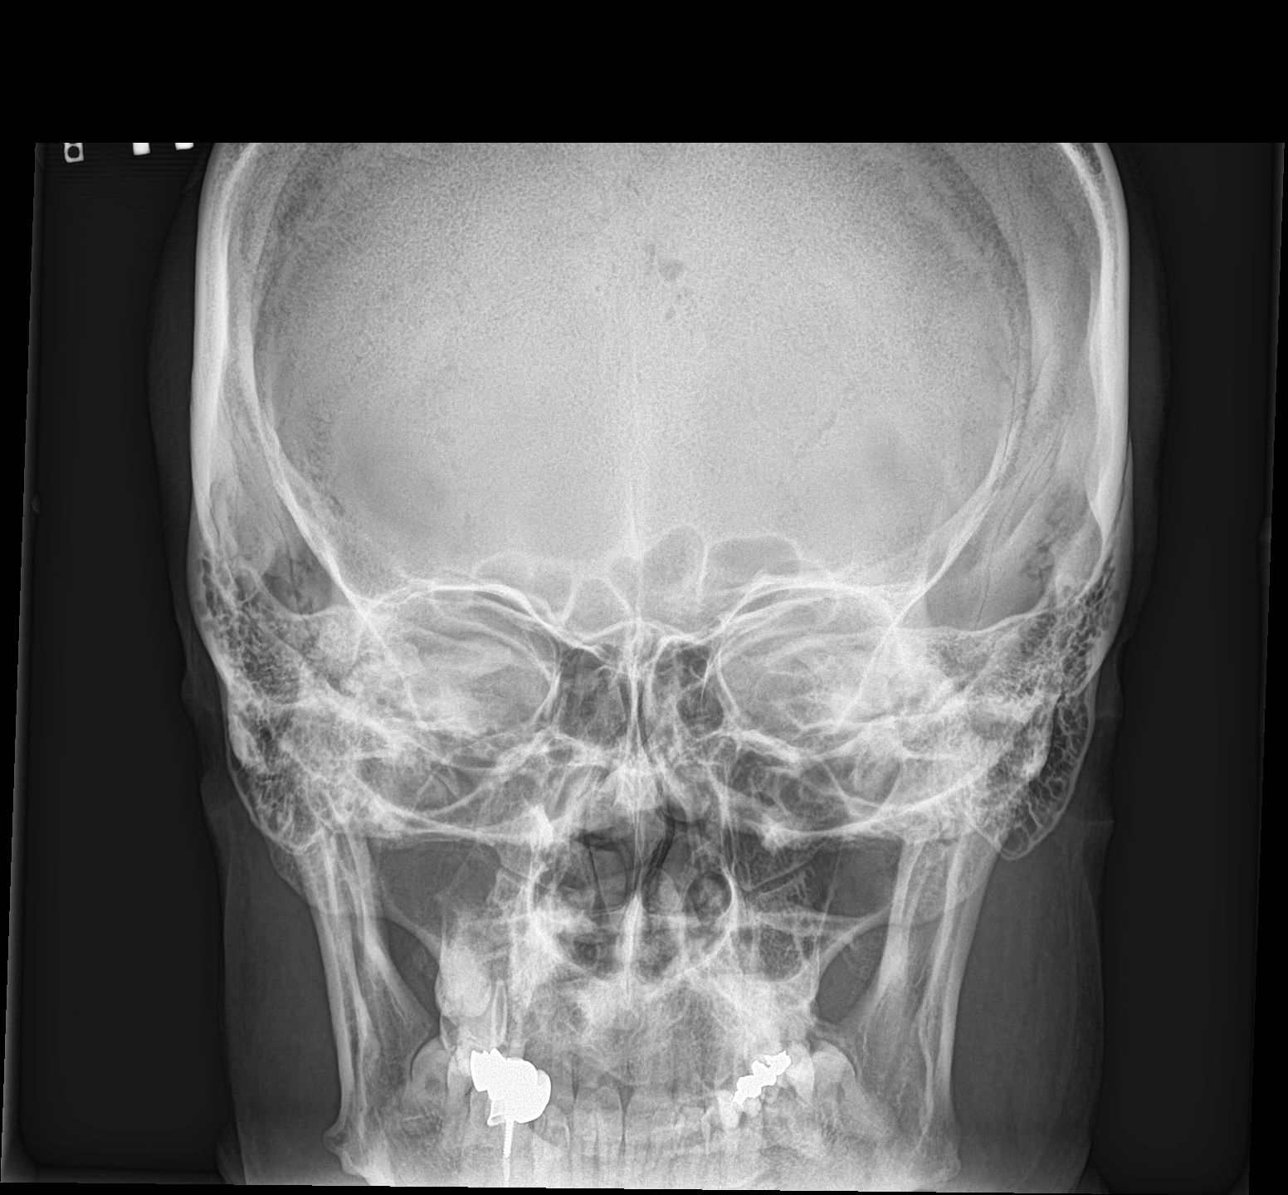

[sinuses waters]
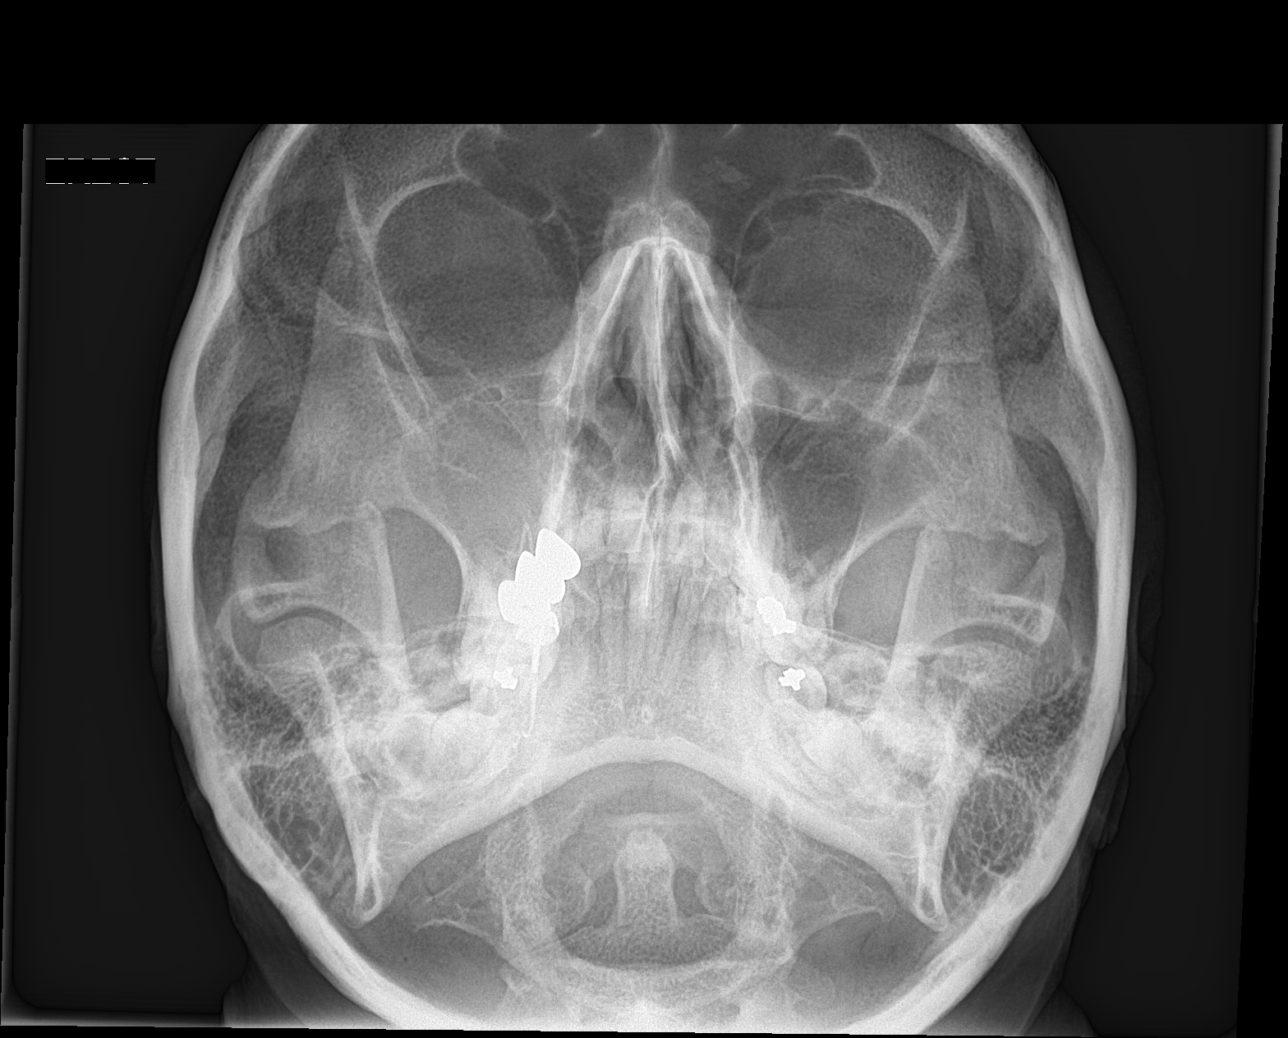

[sinuses lat]
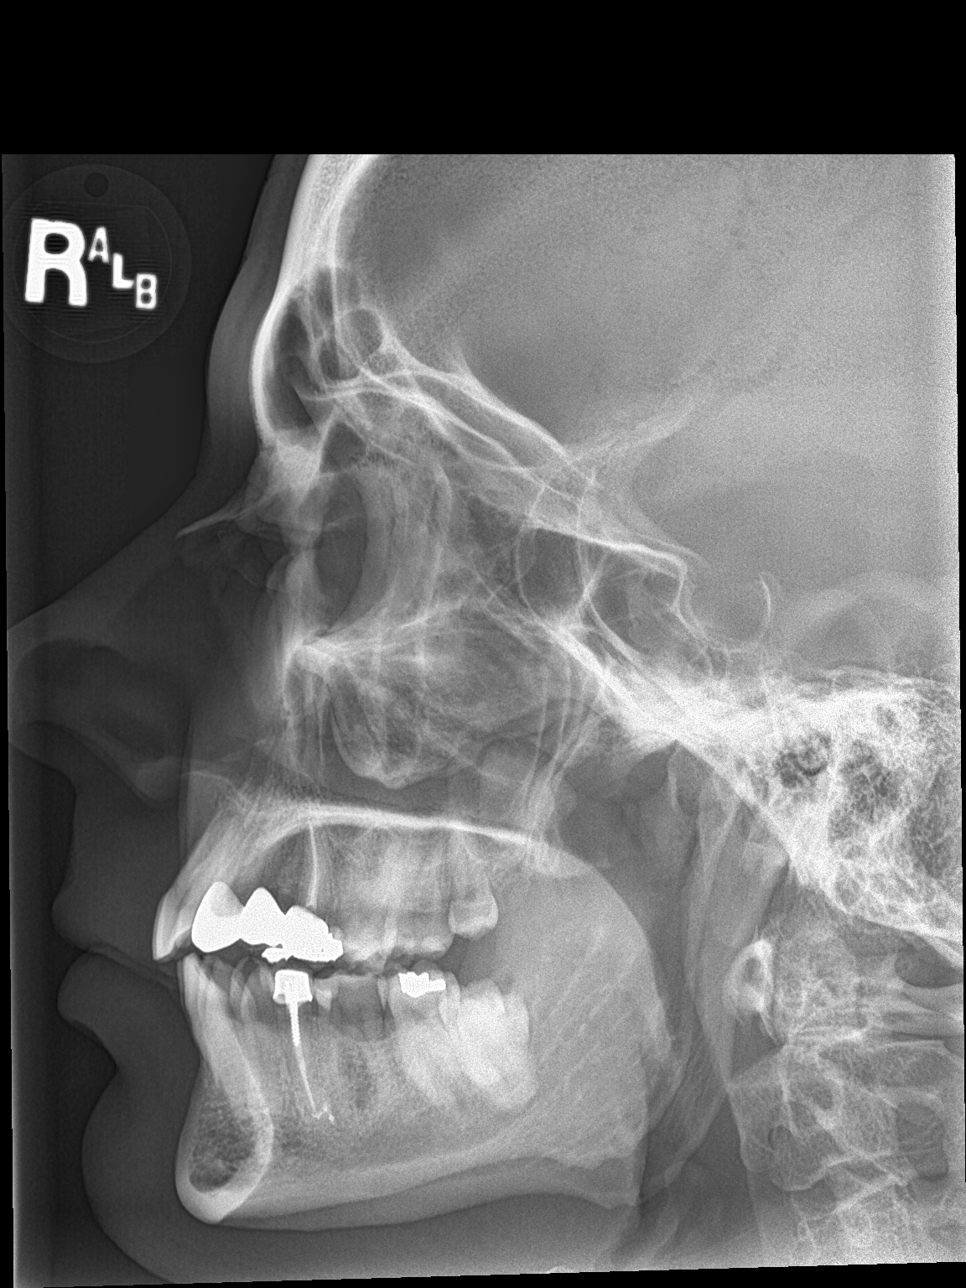

[skull smv]
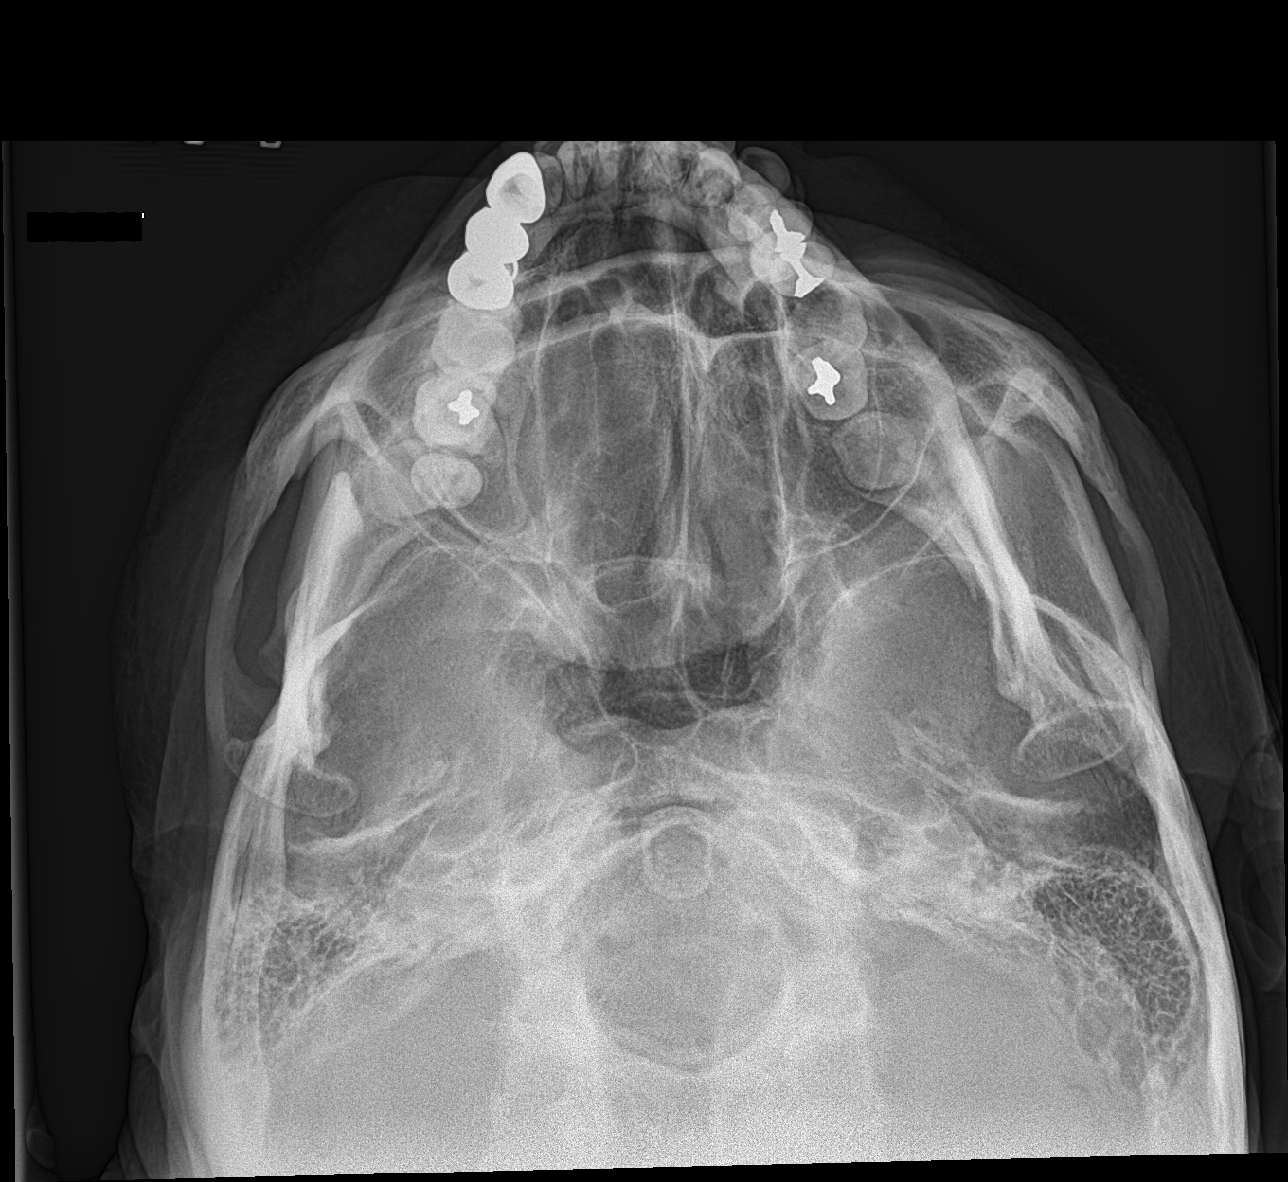

[4 of 4 positions shown; findings below may reference images not displayed]

FINDINGS: As noted previously there is a haziness overlying the right
maxillary sinus probably related to mucosal thickening. No definite
air-fluid level is seen. The remainder of the paranasal sinuses are
clear. No bony abnormality is seen.
IMPRESSION: No change in haziness overlying the right maxillary sinus most
consistent with diffuse mucosal thickening. No air-fluid level is
noted. Limited CT of the paranasal sinuses may be helpful to assess
further.

## 2019-12-23 ENCOUNTER — Encounter: Payer: Self-pay | Admitting: Family Medicine

## 2019-12-23 ENCOUNTER — Ambulatory Visit: Payer: 59 | Admitting: Family Medicine

## 2019-12-23 ENCOUNTER — Other Ambulatory Visit: Payer: Self-pay

## 2019-12-23 VITALS — BP 110/80 | HR 84 | Ht 64.0 in | Wt 170.0 lb

## 2019-12-23 DIAGNOSIS — K219 Gastro-esophageal reflux disease without esophagitis: Secondary | ICD-10-CM | POA: Diagnosis not present

## 2019-12-23 DIAGNOSIS — J302 Other seasonal allergic rhinitis: Secondary | ICD-10-CM

## 2019-12-23 DIAGNOSIS — Z79890 Hormone replacement therapy (postmenopausal): Secondary | ICD-10-CM

## 2019-12-23 DIAGNOSIS — R69 Illness, unspecified: Secondary | ICD-10-CM | POA: Diagnosis not present

## 2019-12-23 DIAGNOSIS — E7801 Familial hypercholesterolemia: Secondary | ICD-10-CM

## 2019-12-23 MED ORDER — PANTOPRAZOLE SODIUM 40 MG PO TBEC: 40.0000 mg | DELAYED_RELEASE_TABLET | Freq: Every day | ORAL | 3 refills | Status: DC

## 2019-12-23 MED ORDER — TESTOSTERONE CYPIONATE 200 MG/ML IM SOLN: 200.0000 mg | INTRAMUSCULAR | 2 refills | Status: DC

## 2019-12-23 MED ORDER — CETIRIZINE HCL 10 MG PO TABS: 10.0000 mg | ORAL_TABLET | Freq: Every day | ORAL | 1 refills | Status: DC

## 2019-12-23 NOTE — Progress Notes (Signed)
Date:  12/23/2019   Name:  Rick Mason   DOB:  May 16, 1976   MRN:  [id number]   Chief Complaint: hormone replacement therapy and Allergic Rhinitis   Patient is a 44 year old male who presents for a gender affirmation hormone  exam. The patient reports the following problems: none. Health maintenance has been reviewed up to date.   Lab Results  Component Value Date   CREATININE 0.94 12/08/2018   BUN 9 12/08/2018   NA 141 12/08/2018   K 4.3 12/08/2018   CL 102 12/08/2018   CO2 23 12/08/2018   Lab Results  Component Value Date   CHOL 233 (H) 06/14/2019   HDL 47 06/14/2019   LDLCALC 161 (H) 06/14/2019   TRIG 123 06/14/2019   CHOLHDL 4.8 12/08/2018   No results found for: TSH No results found for: HGBA1C   Review of Systems  Constitutional: Negative for chills and fever.  HENT: Negative for drooling, ear discharge, ear pain, mouth sores, nosebleeds, postnasal drip and sore throat.   Respiratory: Negative for cough, shortness of breath and wheezing.   Cardiovascular: Negative for chest pain, palpitations and leg swelling.  Gastrointestinal: Negative for abdominal pain, blood in stool, constipation, diarrhea and nausea.  Endocrine: Negative for polydipsia.  Genitourinary: Negative for dysuria, frequency, hematuria and urgency.  Musculoskeletal: Negative for back pain, myalgias and neck pain.  Skin: Negative for rash.  Allergic/Immunologic: Negative for environmental allergies.  Neurological: Negative for dizziness and headaches.  Hematological: Does not bruise/bleed easily.  Psychiatric/Behavioral: Negative for suicidal ideas. The patient is not nervous/anxious.     Patient Active Problem List   Diagnosis Date Noted  . Stomatitis 04/20/2018  . Cervical radiculopathy 04/20/2018  . Hormone replacement therapy (HRT) 04/20/2018  . Viral syndrome 04/20/2018  . Hypogonadism male 05/01/2017  . Mixed hyperlipidemia 05/01/2017  . Taking medication for chronic disease  05/01/2017    Allergies  Allergen Reactions  . Penicillins     Past Surgical History:  Procedure Laterality Date  . MASTECTOMY Bilateral     Social History   Tobacco Use  . Smoking status: Former Smoker    Quit date: 01/21/2015    Years since quitting: 4.9  . Smokeless tobacco: Never Used  Substance Use Topics  . Alcohol use: No    Alcohol/week: 0.0 standard drinks  . Drug use: No     Medication list has been reviewed and updated.  Current Meds  Medication Sig  . B-D SYRINGE LUER SLIP TIP 3CC 3 ML MISC Inject 1 each as directed every 14 (fourteen) days.  . cetirizine (ZYRTEC) 10 MG tablet TAKE 1 TABLET BY MOUTH EVERY DAY  . testosterone cypionate (DEPOTESTOSTERONE CYPIONATE) 200 MG/ML injection Inject 1 mL (200 mg total) into the muscle every 14 (fourteen) days. INJECT  INTRAMUSCULAR EVERY 2 WEEKS  . Triamcinolone Acetonide (NASACORT AQ NA) Place into the nose. otc  . valACYclovir (VALTREX) 1000 MG tablet take 1 tablet by mouth if needed    PHQ 2/9 Scores 12/23/2019 04/20/2018 10/09/2017 01/05/2016  PHQ - 2 Score 0 0 0 0  PHQ- 9 Score 0 0 1 -    BP Readings from Last 3 Encounters:  12/23/19 110/80  06/14/19 120/76  12/08/18 100/64    Physical Exam Vitals and nursing note reviewed.  HENT:     Head: Normocephalic.     Right Ear: Tympanic membrane, ear canal and external ear normal.     Left Ear: Tympanic membrane, ear canal and  external ear normal.     Nose: Nose normal.  Eyes:     General: No scleral icterus.       Right eye: No discharge.        Left eye: No discharge.     Conjunctiva/sclera: Conjunctivae normal.     Pupils: Pupils are equal, round, and reactive to light.  Neck:     Thyroid: No thyromegaly.     Vascular: No JVD.     Trachea: No tracheal deviation.  Cardiovascular:     Rate and Rhythm: Normal rate and regular rhythm.     Heart sounds: Normal heart sounds. No murmur. No friction rub. No gallop.   Pulmonary:     Effort: No respiratory  distress.     Breath sounds: Normal breath sounds. No wheezing or rales.  Abdominal:     General: Bowel sounds are normal.     Palpations: Abdomen is soft. There is no mass.     Tenderness: There is no abdominal tenderness. There is no guarding or rebound.  Musculoskeletal:        General: No tenderness. Normal range of motion.     Cervical back: Normal range of motion and neck supple.  Lymphadenopathy:     Cervical: No cervical adenopathy.  Skin:    General: Skin is warm.     Findings: No rash.  Neurological:     Mental Status: He is alert and oriented to person, place, and time.     Cranial Nerves: No cranial nerve deficit.     Deep Tendon Reflexes: Reflexes are normal and symmetric.     Wt Readings from Last 3 Encounters:  12/23/19 170 lb (77.1 kg)  06/14/19 164 lb (74.4 kg)  12/08/18 161 lb 6.4 oz (73.2 kg)    BP 110/80   Pulse 84   Ht 5\' 4"  (1.626 m)   Wt 170 lb (77.1 kg)   BMI 29.18 kg/m   Assessment and Plan:  1. Hormone replacement therapy (HRT) Chronic.  Controlled.  Stable.  Uncomplicated.  Patient is doing well with gender affirmation hormone therapy and will continue testosterone cypionate 200 mg/mL 1 mL intramuscularly every 14 days.  We will check renal panel, lipid panel, and hepatic panel. - testosterone cypionate (DEPOTESTOSTERONE CYPIONATE) 200 MG/ML injection; Inject 1 mL (200 mg total) into the muscle every 14 (fourteen) days. INJECT 1ML  INTRAMUSCULAR EVERY 2 WEEKS  Dispense: 6 mL; Refill: 2  2. Other seasonal allergic rhinitis Patient with history of seasonal allergies for which she is on Zyrtec and would like a refill 90-day supply. - cetirizine (ZYRTEC) 10 MG tablet; Take 1 tablet (10 mg total) by mouth daily.  Dispense: 90 tablet; Refill: 1  3. Gastroesophageal reflux disease, unspecified whether esophagitis present New onset.  Intermittent.  Patient is having episodes of reflux which is currently trying to be controlled with antacids and  Pepto-Bismol.  We will start with pantoprazole 40 mg once a day for a 6 to 8 weeks.  And then reevaluate with backing down to either Pepcid or continue months. - pantoprazole (PROTONIX) 40 MG tablet; Take 1 tablet (40 mg total) by mouth daily.  Dispense: 30 tablet; Refill: 3  4. Taking multiple medications for chronic disease Patient is on multiple medications which can cause contraindications to lipids liver and electrolytes.  Will obtain above-mentioned labs for confirmation of stability. - Renal Function Panel - Hepatic Function Panel (6)  5. Familial hypercholesterolemia Patient has had elevated readings of LDL in the  past but is very reluctant to start a statin.  Prefers to try exercise and diet but this has not been adhered to in the past.  We have had a long discussion about the importance of controlling risk factors and patient was given a cholesterol sheet to follow.  In the meantime we will check a lipid panel and decide in which direction to go. - Lipid Panel With LDL/HDL Ratio

## 2019-12-24 LAB — RENAL FUNCTION PANEL
Albumin: 4.8 g/dL (ref 4.0–5.0)
BUN/Creatinine Ratio: 8 — ABNORMAL LOW (ref 9–20)
BUN: 8 mg/dL (ref 6–24)
CO2: 26 mmol/L (ref 20–29)
Calcium: 9.4 mg/dL (ref 8.7–10.2)
Chloride: 99 mmol/L (ref 96–106)
Creatinine, Ser: 1.06 mg/dL (ref 0.76–1.27)
GFR calc Af Amer: 100 mL/min/{1.73_m2} (ref >59)
GFR calc non Af Amer: 87 mL/min/{1.73_m2} (ref >59)
Glucose: 84 mg/dL (ref 65–99)
Phosphorus: 3 mg/dL (ref 2.8–4.1)
Potassium: 4.6 mmol/L (ref 3.5–5.2)
Sodium: 140 mmol/L (ref 134–144)

## 2019-12-24 LAB — HEPATIC FUNCTION PANEL (6)
ALT: 22 IU/L (ref 0–44)
AST: 23 IU/L (ref 0–40)
Alkaline Phosphatase: 61 IU/L (ref 39–117)
Bilirubin Total: 0.6 mg/dL (ref 0.0–1.2)
Bilirubin, Direct: 0.16 mg/dL (ref 0.00–0.40)

## 2019-12-24 LAB — LIPID PANEL WITH LDL/HDL RATIO
Cholesterol, Total: 231 mg/dL — ABNORMAL HIGH (ref 100–199)
HDL: 45 mg/dL (ref >39)
LDL Chol Calc (NIH): 160 mg/dL — ABNORMAL HIGH (ref 0–99)
LDL/HDL Ratio: 3.6 ratio (ref 0.0–3.6)
Triglycerides: 141 mg/dL (ref 0–149)
VLDL Cholesterol Cal: 26 mg/dL (ref 5–40)

## 2019-12-27 ENCOUNTER — Other Ambulatory Visit: Payer: Self-pay

## 2019-12-27 DIAGNOSIS — Z79890 Hormone replacement therapy (postmenopausal): Secondary | ICD-10-CM

## 2019-12-27 MED ORDER — EZETIMIBE 10 MG PO TABS: 10.0000 mg | ORAL_TABLET | Freq: Every day | ORAL | 0 refills | Status: DC

## 2020-03-13 ENCOUNTER — Other Ambulatory Visit: Payer: Self-pay

## 2020-03-13 MED ORDER — SCOPOLAMINE 1 MG/3DAYS TD PT72: 1.0000 | MEDICATED_PATCH | TRANSDERMAL | 1 refills | Status: DC

## 2020-03-13 NOTE — Progress Notes (Unsigned)
Sent scop. Patch into pharm. For fishing trip

## 2020-05-31 ENCOUNTER — Other Ambulatory Visit: Payer: Self-pay | Admitting: Family Medicine

## 2020-05-31 MED ORDER — EZETIMIBE 10 MG PO TABS: 10.0000 mg | ORAL_TABLET | Freq: Every day | ORAL | 0 refills | Status: DC

## 2020-05-31 NOTE — Telephone Encounter (Signed)
Medication Refill - Medication: ezetimibe (ZETIA) 10 MG tablet    Preferred Pharmacy (with phone number or street name):  [facility] [phone] - [place], [place] - [address] AT [facility] [address] Phone:  [phone]  Fax:  [phone]       Agent: Please be advised that RX refills may take up to 3 business days. We ask that you follow-up with your pharmacy.

## 2020-05-31 NOTE — Telephone Encounter (Signed)
Requested Prescriptions  Pending Prescriptions Disp Refills   ezetimibe (ZETIA) 10 MG tablet 30 tablet 0    Sig: Take 1 tablet (10 mg total) by mouth daily.     Cardiovascular:  Antilipid - Sterol Transport Inhibitors Failed - 05/31/2020 12:34 PM      Failed - Total Cholesterol in normal range and within 360 days    Cholesterol, Total  Date Value Ref Range Status  12/23/2019 231 (H) 100 - 199 mg/dL Final         Failed - LDL in normal range and within 360 days    LDL Chol Calc (NIH)  Date Value Ref Range Status  12/23/2019 160 (H) 0 - 99 mg/dL Final         Passed - HDL in normal range and within 360 days    HDL  Date Value Ref Range Status  12/23/2019 45 >39 mg/dL Final         Passed - Triglycerides in normal range and within 360 days    Triglycerides  Date Value Ref Range Status  12/23/2019 141 0 - 149 mg/dL Final         Passed - Valid encounter within last 12 months    Recent Outpatient Visits          5 months ago Hormone replacement therapy (HRT)   [facility] Duanne Limerick, MD   11 months ago Hormone replacement therapy (HRT)   [facility] Duanne Limerick, MD   1 year ago Hormone replacement therapy (HRT)   [facility] Duanne Limerick, MD   1 year ago Other seasonal allergic rhinitis   [facility] Duanne Limerick, MD   2 years ago Hormone replacement therapy (HRT)   [facility] Duanne Limerick, MD

## 2020-06-20 ENCOUNTER — Other Ambulatory Visit: Payer: Self-pay | Admitting: Family Medicine

## 2020-06-20 DIAGNOSIS — K219 Gastro-esophageal reflux disease without esophagitis: Secondary | ICD-10-CM

## 2020-06-20 NOTE — Telephone Encounter (Signed)
Requested Prescriptions  Pending Prescriptions Disp Refills  . pantoprazole (PROTONIX) 40 MG tablet [Pharmacy Med Name: PANTOPRAZOLE 40MG  TABLETS] 90 tablet 0    Sig: TAKE 1 TABLET(40 MG) BY MOUTH DAILY     Gastroenterology: Proton Pump Inhibitors Passed - 06/20/2020 10:45 AM      Passed - Valid encounter within last 12 months    Recent Outpatient Visits          6 months ago Hormone replacement therapy (HRT)   [facility] 06/22/2020, MD   1 year ago Hormone replacement therapy (HRT)   [facility] Duanne Limerick, MD   1 year ago Hormone replacement therapy (HRT)   [facility] Duanne Limerick, MD   1 year ago Other seasonal allergic rhinitis   [facility] Duanne Limerick, MD   2 years ago Hormone replacement therapy (HRT)   [facility] Duanne Limerick, MD

## 2020-07-05 ENCOUNTER — Other Ambulatory Visit: Payer: Self-pay | Admitting: Family Medicine

## 2020-07-05 NOTE — Telephone Encounter (Signed)
Requested medication (s) are due for refill today: {yes  Requested medication (s) are on the active medication list: yes  Last refill: 05/21/20  #30  0 refills  Future visit scheduled: No  Notes to clinic: Last labs done 12/23/19 state start zetia recheck labs in 6 weeks      Requested Prescriptions  Pending Prescriptions Disp Refills   ezetimibe (ZETIA) 10 MG tablet [Pharmacy Med Name: EZETIMIBE 10MG  TABLETS] 30 tablet 0    Sig: TAKE 1 TABLET(10 MG) BY MOUTH DAILY      Cardiovascular:  Antilipid - Sterol Transport Inhibitors Failed - 07/05/2020  5:04 PM      Failed - Total Cholesterol in normal range and within 360 days    Cholesterol, Total  Date Value Ref Range Status  12/23/2019 231 (H) 100 - 199 mg/dL Final          Failed - LDL in normal range and within 360 days    LDL Chol Calc (NIH)  Date Value Ref Range Status  12/23/2019 160 (H) 0 - 99 mg/dL Final          Passed - HDL in normal range and within 360 days    HDL  Date Value Ref Range Status  12/23/2019 45 >39 mg/dL Final          Passed - Triglycerides in normal range and within 360 days    Triglycerides  Date Value Ref Range Status  12/23/2019 141 0 - 149 mg/dL Final          Passed - Valid encounter within last 12 months    Recent Outpatient Visits           6 months ago Hormone replacement therapy (HRT)   [facility] 12/25/2019, MD   1 year ago Hormone replacement therapy (HRT)   [facility] Duanne Limerick, MD   1 year ago Hormone replacement therapy (HRT)   [facility] Duanne Limerick, MD   1 year ago Other seasonal allergic rhinitis   [facility] Duanne Limerick, MD   2 years ago Hormone replacement therapy (HRT)   [facility] Duanne Limerick, MD

## 2020-08-28 ENCOUNTER — Other Ambulatory Visit: Payer: Self-pay

## 2020-08-28 ENCOUNTER — Encounter: Payer: Self-pay | Admitting: Family Medicine

## 2020-08-28 ENCOUNTER — Ambulatory Visit: Payer: 59 | Admitting: Family Medicine

## 2020-08-28 VITALS — BP 130/80 | HR 80 | Ht 64.0 in | Wt 170.0 lb

## 2020-08-28 DIAGNOSIS — R69 Illness, unspecified: Secondary | ICD-10-CM

## 2020-08-28 DIAGNOSIS — E7801 Familial hypercholesterolemia: Secondary | ICD-10-CM

## 2020-08-28 DIAGNOSIS — Z23 Encounter for immunization: Secondary | ICD-10-CM

## 2020-08-28 DIAGNOSIS — N39490 Overflow incontinence: Secondary | ICD-10-CM

## 2020-08-28 DIAGNOSIS — E78019 Familial hypercholesterolemia, unspecified: Secondary | ICD-10-CM

## 2020-08-28 DIAGNOSIS — J302 Other seasonal allergic rhinitis: Secondary | ICD-10-CM

## 2020-08-28 DIAGNOSIS — B349 Viral infection, unspecified: Secondary | ICD-10-CM

## 2020-08-28 DIAGNOSIS — Z79890 Hormone replacement therapy (postmenopausal): Secondary | ICD-10-CM | POA: Diagnosis not present

## 2020-08-28 DIAGNOSIS — K219 Gastro-esophageal reflux disease without esophagitis: Secondary | ICD-10-CM | POA: Diagnosis not present

## 2020-08-28 DIAGNOSIS — N393 Stress incontinence (female) (male): Secondary | ICD-10-CM

## 2020-08-28 DIAGNOSIS — K121 Other forms of stomatitis: Secondary | ICD-10-CM

## 2020-08-28 MED ORDER — EZETIMIBE 10 MG PO TABS: ORAL_TABLET | ORAL | 1 refills | Status: DC

## 2020-08-28 MED ORDER — PANTOPRAZOLE SODIUM 40 MG PO TBEC: DELAYED_RELEASE_TABLET | ORAL | Status: DC

## 2020-08-28 MED ORDER — VALACYCLOVIR HCL 1 G PO TABS: ORAL_TABLET | ORAL | 11 refills | Status: DC

## 2020-08-28 MED ORDER — CETIRIZINE HCL 10 MG PO TABS: 10.0000 mg | ORAL_TABLET | Freq: Every day | ORAL | 1 refills | Status: DC

## 2020-08-28 MED ORDER — TESTOSTERONE CYPIONATE 200 MG/ML IM SOLN: 200.0000 mg | INTRAMUSCULAR | 2 refills | Status: DC

## 2020-08-28 NOTE — Patient Instructions (Signed)

## 2020-08-28 NOTE — Progress Notes (Signed)
Date:  08/28/2020   Name:  Rick Mason   DOB:  1976-08-07   MRN:  [id number]   Chief Complaint: Allergic Rhinitis , Gastroesophageal Reflux, Hyperlipidemia, hrt, viral syndrome, Flu Vaccine, and Urinary Incontinence (when sneezes - "pee a little bit")  Patient is a 44 year old male who presents for a HRT exam. The patient reports the following problems: urinary incontinence. Health maintenance has been reviewed up to date.  Gastroesophageal Reflux He reports no abdominal pain, no belching, no chest pain, no choking, no coughing, no dysphagia, no early satiety, no globus sensation, no heartburn, no hoarse voice, no nausea, no sore throat, no stridor, no tooth decay, no water brash or no wheezing. This is a chronic problem. The current episode started more than 1 year ago. The problem occurs occasionally. The problem has been gradually improving. The symptoms are aggravated by certain foods. Pertinent negatives include no anemia, fatigue, melena, muscle weakness, orthopnea or weight loss. He has tried a PPI for the symptoms. The treatment provided moderate relief. Past procedures do not include an abdominal ultrasound, esophageal pH monitoring or a UGI.  Hyperlipidemia This is a chronic problem. The current episode started more than 1 year ago. The problem is controlled. Recent lipid tests were reviewed and are normal. He has no history of chronic renal disease, diabetes, hypothyroidism, liver disease, obesity or nephrotic syndrome. There are no known factors aggravating his hyperlipidemia. Pertinent negatives include no chest pain, focal sensory loss, focal weakness, leg pain, myalgias or shortness of breath. Current antihyperlipidemic treatment includes ezetimibe. The current treatment provides moderate improvement of lipids. There are no compliance problems.  Risk factors for coronary artery disease include dyslipidemia and hypertension.    Lab Results  Component Value Date   CREATININE 1.06  12/23/2019   BUN 8 12/23/2019   NA 140 12/23/2019   K 4.6 12/23/2019   CL 99 12/23/2019   CO2 26 12/23/2019   Lab Results  Component Value Date   CHOL 231 (H) 12/23/2019   HDL 45 12/23/2019   LDLCALC 160 (H) 12/23/2019   TRIG 141 12/23/2019   CHOLHDL 4.8 12/08/2018   No results found for: TSH No results found for: HGBA1C Lab Results  Component Value Date   HGB 16.9 06/14/2019   Lab Results  Component Value Date   ALT 22 12/23/2019   AST 23 12/23/2019   ALKPHOS 61 12/23/2019   BILITOT 0.6 12/23/2019     Review of Systems  Constitutional: Negative for chills, fatigue, fever and weight loss.  HENT: Negative for drooling, ear discharge, ear pain, hoarse voice and sore throat.   Respiratory: Negative for cough, choking, shortness of breath and wheezing.   Cardiovascular: Negative for chest pain, palpitations and leg swelling.  Gastrointestinal: Negative for abdominal pain, blood in stool, constipation, diarrhea, dysphagia, heartburn, melena and nausea.  Endocrine: Negative for polydipsia.  Genitourinary: Negative for dysuria, frequency, hematuria and urgency.  Musculoskeletal: Negative for back pain, myalgias, muscle weakness and neck pain.  Skin: Negative for rash.  Allergic/Immunologic: Negative for environmental allergies.  Neurological: Negative for dizziness, focal weakness and headaches.  Hematological: Does not bruise/bleed easily.  Psychiatric/Behavioral: Negative for suicidal ideas. The patient is not nervous/anxious.     Patient Active Problem List   Diagnosis Date Noted  . Stomatitis 04/20/2018  . Cervical radiculopathy 04/20/2018  . Hormone replacement therapy (HRT) 04/20/2018  . Viral syndrome 04/20/2018  . Hypogonadism male 05/01/2017  . Mixed hyperlipidemia 05/01/2017  . Taking medication for  chronic disease 05/01/2017    Allergies  Allergen Reactions  . Penicillins     Past Surgical History:  Procedure Laterality Date  . MASTECTOMY Bilateral      Social History   Tobacco Use  . Smoking status: Former Smoker    Quit date: 01/21/2015    Years since quitting: 5.6  . Smokeless tobacco: Never Used  Substance Use Topics  . Alcohol use: No    Alcohol/week: 0.0 standard drinks  . Drug use: No     Medication list has been reviewed and updated.  Current Meds  Medication Sig  . B-D SYRINGE LUER SLIP TIP 3CC 3 ML MISC Inject 1 each as directed every 14 (fourteen) days.  . cetirizine (ZYRTEC) 10 MG tablet Take 1 tablet (10 mg total) by mouth daily.  Marland Kitchen ezetimibe (ZETIA) 10 MG tablet TAKE 1 TABLET(10 MG) BY MOUTH DAILY  . ketoconazole (NIZORAL) 2 % cream Apply 1 application topically daily.  . pantoprazole (PROTONIX) 40 MG tablet TAKE 1 TABLET(40 MG) BY MOUTH DAILY  . testosterone cypionate (DEPOTESTOSTERONE CYPIONATE) 200 MG/ML injection Inject 1 mL (200 mg total) into the muscle every 14 (fourteen) days. INJECT  INTRAMUSCULAR EVERY 2 WEEKS  . Triamcinolone Acetonide (NASACORT AQ NA) Place into the nose. otc  . valACYclovir (VALTREX) 1000 MG tablet take 1 tablet by mouth if needed    PHQ 2/9 Scores 08/28/2020 12/23/2019 04/20/2018 10/09/2017  PHQ - 2 Score 0 0 0 0  PHQ- 9 Score 0 0 0 1    GAD 7 : Generalized Anxiety Score 08/28/2020 12/23/2019  Nervous, Anxious, on Edge 0 0  Control/stop worrying 0 0  Worry too much - different things 0 0  Trouble relaxing 0 0  Restless 0 0  Easily annoyed or irritable 0 0  Afraid - awful might happen 0 0  Total GAD 7 Score 0 0    BP Readings from Last 3 Encounters:  08/28/20 130/80  12/23/19 110/80  06/14/19 120/76    Physical Exam Vitals and nursing note reviewed.  HENT:     Head: Normocephalic.     Right Ear: Tympanic membrane and external ear normal.     Left Ear: Tympanic membrane and external ear normal.     Nose: Nose normal.  Eyes:     General: No scleral icterus.       Right eye: No discharge.        Left eye: No discharge.     Conjunctiva/sclera: Conjunctivae  normal.     Pupils: Pupils are equal, round, and reactive to light.  Neck:     Thyroid: No thyromegaly.     Vascular: No JVD.     Trachea: No tracheal deviation.  Cardiovascular:     Rate and Rhythm: Normal rate and regular rhythm.     Heart sounds: Normal heart sounds. No murmur heard.  No friction rub. No gallop.   Pulmonary:     Effort: No respiratory distress.     Breath sounds: Normal breath sounds. No wheezing, rhonchi or rales.  Abdominal:     General: Bowel sounds are normal.     Palpations: Abdomen is soft. There is no mass.     Tenderness: There is no abdominal tenderness. There is no guarding or rebound.  Musculoskeletal:        General: No tenderness. Normal range of motion.     Cervical back: Normal range of motion and neck supple.  Lymphadenopathy:     Cervical: No cervical  adenopathy.  Skin:    General: Skin is warm.     Findings: No rash.  Neurological:     Mental Status: He is alert and oriented to person, place, and time.     Cranial Nerves: No cranial nerve deficit.     Deep Tendon Reflexes: Reflexes are normal and symmetric.     Wt Readings from Last 3 Encounters:  08/28/20 170 lb (77.1 kg)  12/23/19 170 lb (77.1 kg)  06/14/19 164 lb (74.4 kg)    BP 130/80   Pulse 80   Ht 5\' 4"  (1.626 m)   Wt 170 lb (77.1 kg)   BMI 29.18 kg/m   Assessment and Plan: 1. Hormone replacement therapy (HRT) .  Controlled.  Stable.  Continue current dosing of testosterone cypionate.  2. Taking multiple medications for chronic disease Chronic.  Controlled.  Stable.  Will check renal function panel for concerns. - Renal Function Panel  3. Familial hypercholesterolemia Chronic.  Controlled.  Stable.  Patient is currently on dietary control and will continue to do so but we will check a lipid panel and determine if medication needs are necessary. - Lipid Panel With LDL/HDL Ratio  4. Gastroesophageal reflux disease, unspecified whether esophagitis present .   Controlled.  Stable.  Continue pantoprazole 40 mg once a day. - pantoprazole (PROTONIX) 40 MG tablet; TAKE 1 TABLET(40 MG) BY MOUTH DAILY  Dispense: 90 tablet; Refill: 01  5. 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  Episodic.  Patient is noted increased episodes of small urinary leakage with change in position.  We will begin with Kegel exercise as well as low threshold for referral to GYN for consideration of hysterectomy which may also help with urinary frequency.  6. Viral syndrome Chronic.  Episodic.  Relatively stable.  Patient has been refilled on his valacyclovir 1 g as needed. - valACYclovir (VALTREX) 1000 MG tablet; take 1 tablet by mouth if needed  Dispense: 30 tablet; Refill: 11  7. Other seasonal allergic rhinitis Patient will continue Nasonex and Zyrtec as needed.  8. Stomatitis .  Episodic.  We will continue valacyclovir 1 g 3 times a day as needed. - valACYclovir (VALTREX) 1000 MG tablet; take 1 tablet by mouth if needed  Dispense: 30 tablet; Refill: 11  9. Need for immunization against influenza Gust and administered. - Flu Vaccine QUAD 36+ mos IM

## 2020-08-29 ENCOUNTER — Encounter: Payer: Self-pay | Admitting: Family Medicine

## 2020-08-29 LAB — LIPID PANEL WITH LDL/HDL RATIO
Cholesterol, Total: 224 mg/dL — ABNORMAL HIGH (ref 100–199)
HDL: 41 mg/dL (ref >39)
LDL Chol Calc (NIH): 155 mg/dL — ABNORMAL HIGH (ref 0–99)
LDL/HDL Ratio: 3.8 ratio — ABNORMAL HIGH (ref 0.0–3.6)
Triglycerides: 152 mg/dL — ABNORMAL HIGH (ref 0–149)
VLDL Cholesterol Cal: 28 mg/dL (ref 5–40)

## 2020-08-29 LAB — RENAL FUNCTION PANEL
Albumin: 4.9 g/dL (ref 4.0–5.0)
BUN/Creatinine Ratio: 10 (ref 9–20)
BUN: 9 mg/dL (ref 6–24)
CO2: 25 mmol/L (ref 20–29)
Calcium: 9.5 mg/dL (ref 8.7–10.2)
Chloride: 99 mmol/L (ref 96–106)
Creatinine, Ser: 0.9 mg/dL (ref 0.76–1.27)
GFR calc Af Amer: 122 mL/min/{1.73_m2} (ref >59)
GFR calc non Af Amer: 106 mL/min/{1.73_m2} (ref >59)
Glucose: 81 mg/dL (ref 65–99)
Phosphorus: 3.9 mg/dL (ref 2.8–4.1)
Potassium: 3.9 mmol/L (ref 3.5–5.2)
Sodium: 137 mmol/L (ref 134–144)

## 2020-10-09 ENCOUNTER — Telehealth: Payer: Self-pay | Admitting: Family Medicine

## 2020-10-09 NOTE — Telephone Encounter (Signed)
Copied from CRM [id number]. Topic: General - Other >> Oct 09, 2020 11:49 AM Gwenlyn Fudge wrote: Reason for CRM: Pt called stating that he has a dental procedure this afternoon at 2pm today. He states that he is needing to have an antibiotic and that the dentist was supposed to do this and failed to. Please advise.      [facility] [phone] - [place], [place] - [phone] AT [facility] OF [address] & [address]  [address] [place] [address]  Phone: [phone] Fax: [phone]  Hours: Not open 24 hours

## 2020-10-09 NOTE — Telephone Encounter (Signed)
We do not have any reason to prescribe an antibiotic for dental purposes. We cannot do this

## 2021-01-30 ENCOUNTER — Ambulatory Visit: Payer: 59 | Admitting: Family Medicine

## 2021-02-01 ENCOUNTER — Ambulatory Visit: Payer: 59 | Admitting: Family Medicine

## 2021-02-01 ENCOUNTER — Encounter: Payer: Self-pay | Admitting: Family Medicine

## 2021-02-01 ENCOUNTER — Other Ambulatory Visit: Payer: Self-pay

## 2021-02-01 VITALS — BP 110/60 | HR 78 | Ht 64.0 in | Wt 168.0 lb

## 2021-02-01 DIAGNOSIS — Z79890 Hormone replacement therapy (postmenopausal): Secondary | ICD-10-CM

## 2021-02-01 DIAGNOSIS — E7801 Familial hypercholesterolemia: Secondary | ICD-10-CM | POA: Diagnosis not present

## 2021-02-01 NOTE — Progress Notes (Signed)
Date:  02/01/2021   Name:  Rick Mason   DOB:  10-07-1976   MRN:  [id number]   Chief Complaint: hormone replacement and Hyperlipidemia  Patient is a 45 year old male who presents for a HRT exam. The patient reports the following problems: low testosterone. Health maintenance has been reviewed up to date.  Hyperlipidemia This is a chronic problem. The current episode started more than 1 year ago. The problem is controlled. Recent lipid tests were reviewed and are normal. He has no history of chronic renal disease, diabetes, hypothyroidism, liver disease, obesity or nephrotic syndrome. There are no known factors aggravating his hyperlipidemia. Pertinent negatives include no chest pain, focal sensory loss, focal weakness, leg pain, myalgias or shortness of breath. Current antihyperlipidemic treatment includes statins. The current treatment provides moderate improvement of lipids. There are no compliance problems.  Risk factors for coronary artery disease include dyslipidemia.    Lab Results  Component Value Date   CREATININE 0.90 08/28/2020   BUN 9 08/28/2020   NA 137 08/28/2020   K 3.9 08/28/2020   CL 99 08/28/2020   CO2 25 08/28/2020   Lab Results  Component Value Date   CHOL 224 (H) 08/28/2020   HDL 41 08/28/2020   LDLCALC 155 (H) 08/28/2020   TRIG 152 (H) 08/28/2020   CHOLHDL 4.8 12/08/2018   No results found for: TSH No results found for: HGBA1C Lab Results  Component Value Date   HGB 16.9 06/14/2019   Lab Results  Component Value Date   ALT 22 12/23/2019   AST 23 12/23/2019   ALKPHOS 61 12/23/2019   BILITOT 0.6 12/23/2019     Review of Systems  Constitutional: Negative for chills and fever.  HENT: Negative for drooling, ear discharge, ear pain and sore throat.   Respiratory: Negative for cough, shortness of breath and wheezing.   Cardiovascular: Negative for chest pain, palpitations and leg swelling.  Gastrointestinal: Negative for abdominal pain, blood in  stool, constipation, diarrhea and nausea.  Endocrine: Negative for polydipsia.  Genitourinary: Negative for dysuria, frequency, hematuria and urgency.  Musculoskeletal: Negative for back pain, myalgias and neck pain.  Skin: Negative for rash.  Allergic/Immunologic: Negative for environmental allergies.  Neurological: Negative for dizziness, focal weakness and headaches.  Hematological: Does not bruise/bleed easily.  Psychiatric/Behavioral: Negative for suicidal ideas. The patient is not nervous/anxious.     Patient Active Problem List   Diagnosis Date Noted  . Stomatitis 04/20/2018  . Cervical radiculopathy 04/20/2018  . Hormone replacement therapy (HRT) 04/20/2018  . Viral syndrome 04/20/2018  . Hypogonadism male 05/01/2017  . Mixed hyperlipidemia 05/01/2017  . Taking medication for chronic disease 05/01/2017    Allergies  Allergen Reactions  . Penicillins     Past Surgical History:  Procedure Laterality Date  . MASTECTOMY Bilateral     Social History   Tobacco Use  . Smoking status: Former Smoker    Quit date: 01/21/2015    Years since quitting: 6.0  . Smokeless tobacco: Never Used  Substance Use Topics  . Alcohol use: No    Alcohol/week: 0.0 standard drinks  . Drug use: No     Medication list has been reviewed and updated.  Current Meds  Medication Sig  . B-D SYRINGE LUER SLIP TIP 3CC 3 ML MISC Inject 1 each as directed every 14 (fourteen) days.  . cetirizine (ZYRTEC) 10 MG tablet Take 1 tablet (10 mg total) by mouth daily.  Marland Kitchen ezetimibe (ZETIA) 10 MG tablet TAKE 1  TABLET(10 MG) BY MOUTH DAILY  . ketoconazole (NIZORAL) 2 % cream Apply 1 application topically daily.  . pantoprazole (PROTONIX) 40 MG tablet TAKE 1 TABLET(40 MG) BY MOUTH DAILY  . testosterone cypionate (DEPOTESTOSTERONE CYPIONATE) 200 MG/ML injection Inject 1 mL (200 mg total) into the muscle every 14 (fourteen) days. INJECT  INTRAMUSCULAR EVERY 2 WEEKS  . Triamcinolone Acetonide (NASACORT AQ  NA) Place into the nose. otc  . valACYclovir (VALTREX) 1000 MG tablet take 1 tablet by mouth if needed    PHQ 2/9 Scores 08/28/2020 12/23/2019 04/20/2018 10/09/2017  PHQ - 2 Score 0 0 0 0  PHQ- 9 Score 0 0 0 1    GAD 7 : Generalized Anxiety Score 08/28/2020 12/23/2019  Nervous, Anxious, on Edge 0 0  Control/stop worrying 0 0  Worry too much - different things 0 0  Trouble relaxing 0 0  Restless 0 0  Easily annoyed or irritable 0 0  Afraid - awful might happen 0 0  Total GAD 7 Score 0 0    BP Readings from Last 3 Encounters:  02/01/21 110/60  08/28/20 130/80  12/23/19 110/80    Physical Exam HENT:     Head: Normocephalic.     Right Ear: Tympanic membrane, ear canal and external ear normal. There is no impacted cerumen.     Left Ear: Tympanic membrane, ear canal and external ear normal. There is no impacted cerumen.     Nose: Nose normal. No congestion or rhinorrhea.     Mouth/Throat:     Mouth: Oropharynx is clear and moist. Mucous membranes are moist.  Eyes:     General: No scleral icterus.       Right eye: No discharge.        Left eye: No discharge.     Extraocular Movements: EOM normal.     Conjunctiva/sclera: Conjunctivae normal.     Pupils: Pupils are equal, round, and reactive to light.  Neck:     Thyroid: No thyromegaly.     Vascular: No JVD.     Trachea: No tracheal deviation.  Cardiovascular:     Rate and Rhythm: Normal rate and regular rhythm.     Pulses: Intact distal pulses.     Heart sounds: Normal heart sounds. No murmur heard. No friction rub. No gallop.   Pulmonary:     Effort: No respiratory distress.     Breath sounds: Normal breath sounds. No wheezing, rhonchi or rales.  Abdominal:     General: Bowel sounds are normal.     Palpations: Abdomen is soft. There is no hepatosplenomegaly or mass.     Tenderness: There is no abdominal tenderness. There is no CVA tenderness, guarding or rebound.  Musculoskeletal:        General: No tenderness or edema.  Normal range of motion.     Cervical back: Normal range of motion and neck supple.  Lymphadenopathy:     Cervical: No cervical adenopathy.  Skin:    General: Skin is warm.     Findings: No rash.  Neurological:     Mental Status: He is alert and oriented to person, place, and time.     Cranial Nerves: No cranial nerve deficit.     Deep Tendon Reflexes: Strength normal and reflexes are normal and symmetric.     Wt Readings from Last 3 Encounters:  02/01/21 168 lb (76.2 kg)  08/28/20 170 lb (77.1 kg)  12/23/19 170 lb (77.1 kg)    BP 110/60  Pulse 78   Ht 5\' 4"  (1.626 m)   Wt 168 lb (76.2 kg)   BMI 28.84 kg/m   Assessment and Plan: 1. Familial hypercholesterolemia New onset.  Patient patient is currently on Zetia 10 mg once a day.  We are checking a true fasting lipid on medication to see if this is working and if not then we may have to go to other means of controlling cholesterol. - Lipid Panel With LDL/HDL Ratio  2. Hormone replacement therapy (HRT) .  Controlled.  Stable.  Doing well on current testosterone replacement.  We will continue with testosterone cypionate 200 mg/cc 1 cc every 2 weeks.  Will check testosterone free and total and he is in true trough level. - Comprehensive Metabolic Panel (CMET) - Testosterone,Free and Total

## 2021-02-03 LAB — LIPID PANEL WITH LDL/HDL RATIO
Cholesterol, Total: 221 mg/dL — ABNORMAL HIGH (ref 100–199)
HDL: 44 mg/dL (ref >39)
LDL Chol Calc (NIH): 159 mg/dL — ABNORMAL HIGH (ref 0–99)
LDL/HDL Ratio: 3.6 ratio (ref 0.0–3.6)
Triglycerides: 99 mg/dL (ref 0–149)
VLDL Cholesterol Cal: 18 mg/dL (ref 5–40)

## 2021-02-03 LAB — TESTOSTERONE,FREE AND TOTAL
Testosterone, Free: 11.1 pg/mL (ref 6.8–21.5)
Testosterone: 346 ng/dL (ref 264–916)

## 2021-02-03 LAB — COMPREHENSIVE METABOLIC PANEL
ALT: 32 IU/L (ref 0–44)
AST: 23 IU/L (ref 0–40)
Albumin/Globulin Ratio: 1.8 (ref 1.2–2.2)
Albumin: 4.8 g/dL (ref 4.0–5.0)
Alkaline Phosphatase: 56 IU/L (ref 44–121)
BUN/Creatinine Ratio: 8 — ABNORMAL LOW (ref 9–20)
BUN: 8 mg/dL (ref 6–24)
Bilirubin Total: 0.7 mg/dL (ref 0.0–1.2)
CO2: 21 mmol/L (ref 20–29)
Calcium: 9.5 mg/dL (ref 8.7–10.2)
Chloride: 97 mmol/L (ref 96–106)
Creatinine, Ser: 1 mg/dL (ref 0.76–1.27)
Globulin, Total: 2.6 g/dL (ref 1.5–4.5)
Glucose: 82 mg/dL (ref 65–99)
Potassium: 4.5 mmol/L (ref 3.5–5.2)
Sodium: 135 mmol/L (ref 134–144)
Total Protein: 7.4 g/dL (ref 6.0–8.5)
eGFR: 96 mL/min/{1.73_m2} (ref >59)

## 2021-02-13 ENCOUNTER — Ambulatory Visit: Payer: 59 | Admitting: Family Medicine

## 2021-02-21 ENCOUNTER — Other Ambulatory Visit: Payer: Self-pay | Admitting: Family Medicine

## 2021-02-21 NOTE — Telephone Encounter (Signed)
Requested Prescriptions  Pending Prescriptions Disp Refills  . cetirizine (ZYRTEC) 10 MG tablet [Pharmacy Med Name: CETIRIZINE 10MG  TABLETS] 90 tablet 1    Sig: TAKE 1 TABLET(10 MG) BY MOUTH DAILY     Ear, Nose, and Throat:  Antihistamines Passed - 02/21/2021  6:35 AM      Passed - Valid encounter within last 12 months    Recent Outpatient Visits          2 weeks ago Familial hypercholesterolemia   [facility] 02/23/2021, MD   5 months ago Hormone replacement therapy (HRT)   [facility] Duanne Limerick, MD   1 year ago Hormone replacement therapy (HRT)   [facility] Duanne Limerick, MD   1 year ago Hormone replacement therapy (HRT)   [facility] Duanne Limerick, MD   2 years ago Hormone replacement therapy (HRT)   [facility] Duanne Limerick, MD

## 2021-02-23 ENCOUNTER — Telehealth: Payer: Self-pay | Admitting: Family Medicine

## 2021-02-23 NOTE — Telephone Encounter (Signed)
error 

## 2021-03-21 ENCOUNTER — Other Ambulatory Visit: Payer: Self-pay | Admitting: Family Medicine

## 2021-03-21 MED ORDER — EZETIMIBE 10 MG PO TABS: ORAL_TABLET | ORAL | 1 refills | Status: DC

## 2021-03-21 NOTE — Telephone Encounter (Signed)
Copied from CRM [id number]. Topic: Quick Communication - Rx Refill/Question >> Mar 21, 2021  9:50 AM Jaquita Rector A wrote: Medication: ezetimibe (ZETIA) 10 MG tablet   Has the patient contacted their pharmacy? Yes.   (Agent: If no, request that the patient contact the pharmacy for the refill.) (Agent: If yes, when and what did the pharmacy advise?)  Preferred Pharmacy (with phone number or street name): [facility] [phone] - [place], [place] - [address] AT [facility] OF [address]  Phone:  [phone] Fax:  [phone]     Agent: Please be advised that RX refills may take up to 3 business days. We ask that you follow-up with your pharmacy.

## 2021-05-01 ENCOUNTER — Telehealth: Payer: Self-pay

## 2021-05-01 NOTE — Telephone Encounter (Signed)
Copied from CRM [id number]. Topic: General - Inquiry >> May 01, 2021  3:37 PM Aretta Nip wrote: testosterone cypionate (DEPOTESTOSTERONE CYPIONATE) 200 MG/ML injection  Inject 1 mL (200 mg total) into the muscle every 14 (fourteen) days. INJECT INTRAMUSCULAR EVERY 2 WEEKS, Starting Mon 08/28/2020, Print  Pt states refused and know probably needs to come in but states cannot come in as covid positive. Has a dose that is cloudy and wants to kow if ok to take that. Wants a FU call from nurse. [phone]

## 2021-05-02 NOTE — Telephone Encounter (Signed)
Called and left pt a vm to call office back and schedule appt for July

## 2021-05-16 ENCOUNTER — Other Ambulatory Visit: Payer: Self-pay

## 2021-05-16 DIAGNOSIS — K219 Gastro-esophageal reflux disease without esophagitis: Secondary | ICD-10-CM

## 2021-05-16 MED ORDER — PANTOPRAZOLE SODIUM 40 MG PO TBEC: DELAYED_RELEASE_TABLET | ORAL | 0 refills | Status: DC

## 2021-06-27 ENCOUNTER — Other Ambulatory Visit: Payer: Self-pay | Admitting: Family Medicine

## 2021-06-28 ENCOUNTER — Telehealth: Payer: Self-pay

## 2021-06-28 NOTE — Telephone Encounter (Signed)
Called and left message at pharmacy to refill testosterone cypionate - keep appt for Sept

## 2021-07-02 ENCOUNTER — Ambulatory Visit (INDEPENDENT_AMBULATORY_CARE_PROVIDER_SITE_OTHER): Payer: 59 | Admitting: Family Medicine

## 2021-07-02 ENCOUNTER — Encounter: Payer: Self-pay | Admitting: Family Medicine

## 2021-07-02 ENCOUNTER — Other Ambulatory Visit: Payer: Self-pay

## 2021-07-02 VITALS — BP 124/80 | HR 78 | Ht 64.0 in | Wt 165.0 lb

## 2021-07-02 DIAGNOSIS — K219 Gastro-esophageal reflux disease without esophagitis: Secondary | ICD-10-CM | POA: Diagnosis not present

## 2021-07-02 DIAGNOSIS — Z79890 Hormone replacement therapy (postmenopausal): Secondary | ICD-10-CM | POA: Diagnosis not present

## 2021-07-02 DIAGNOSIS — E7801 Familial hypercholesterolemia: Secondary | ICD-10-CM | POA: Diagnosis not present

## 2021-07-02 MED ORDER — EZETIMIBE 10 MG PO TABS: ORAL_TABLET | ORAL | 1 refills | Status: DC

## 2021-07-02 MED ORDER — PANTOPRAZOLE SODIUM 40 MG PO TBEC: DELAYED_RELEASE_TABLET | ORAL | 1 refills | Status: DC

## 2021-07-02 MED ORDER — TESTOSTERONE CYPIONATE 200 MG/ML IM SOLN: 200.0000 mg | INTRAMUSCULAR | 2 refills | Status: DC

## 2021-07-02 NOTE — Progress Notes (Signed)
Date:  07/02/2021   Name:  Rick Mason   DOB:  03-15-1976   MRN:  [id number]   Chief Complaint: HRT  Patient is a 45  year old male who presents for a hormone affrmation  exam. The patient reports the following problems: none. Health maintenance has been reviewed up to date.     Lab Results  Component Value Date   CREATININE 1.00 02/01/2021   BUN 8 02/01/2021   NA 135 02/01/2021   K 4.5 02/01/2021   CL 97 02/01/2021   CO2 21 02/01/2021   Lab Results  Component Value Date   CHOL 221 (H) 02/01/2021   HDL 44 02/01/2021   LDLCALC 159 (H) 02/01/2021   TRIG 99 02/01/2021   CHOLHDL 4.8 12/08/2018   No results found for: TSH No results found for: HGBA1C Lab Results  Component Value Date   HGB 16.9 06/14/2019   Lab Results  Component Value Date   ALT 32 02/01/2021   AST 23 02/01/2021   ALKPHOS 56 02/01/2021   BILITOT 0.7 02/01/2021     Review of Systems  Constitutional:  Negative for chills and fever.  HENT:  Negative for drooling, ear discharge, ear pain and sore throat.   Respiratory:  Negative for cough, shortness of breath and wheezing.   Cardiovascular:  Negative for chest pain, palpitations and leg swelling.  Gastrointestinal:  Negative for abdominal pain, blood in stool, constipation, diarrhea and nausea.  Endocrine: Negative for polydipsia.  Genitourinary:  Negative for dysuria, frequency, hematuria and urgency.  Musculoskeletal:  Negative for back pain, myalgias and neck pain.  Skin:  Negative for rash.  Allergic/Immunologic: Negative for environmental allergies.  Neurological:  Negative for dizziness and headaches.  Hematological:  Does not bruise/bleed easily.  Psychiatric/Behavioral:  Negative for suicidal ideas. The patient is not nervous/anxious.    Patient Active Problem List   Diagnosis Date Noted   Stomatitis 04/20/2018   Cervical radiculopathy 04/20/2018   Hormone replacement therapy (HRT) 04/20/2018   Viral syndrome 04/20/2018    Hypogonadism male 05/01/2017   Mixed hyperlipidemia 05/01/2017   Taking medication for chronic disease 05/01/2017    Allergies  Allergen Reactions   Penicillins     Past Surgical History:  Procedure Laterality Date   MASTECTOMY Bilateral     Social History   Tobacco Use   Smoking status: Former    Types: Cigarettes    Quit date: 01/21/2015    Years since quitting: 6.4   Smokeless tobacco: Never  Substance Use Topics   Alcohol use: No    Alcohol/week: 0.0 standard drinks   Drug use: No     Medication list has been reviewed and updated.  Current Meds  Medication Sig   B-D SYRINGE LUER SLIP TIP 3CC 3 ML MISC Inject 1 each as directed every 14 (fourteen) days.   cetirizine (ZYRTEC) 10 MG tablet TAKE 1 TABLET(10 MG) BY MOUTH DAILY   ezetimibe (ZETIA) 10 MG tablet TAKE 1 TABLET(10 MG) BY MOUTH DAILY   ketoconazole (NIZORAL) 2 % cream Apply 1 application topically daily.   pantoprazole (PROTONIX) 40 MG tablet TAKE 1 TABLET(40 MG) BY MOUTH DAILY   testosterone cypionate (DEPOTESTOSTERONE CYPIONATE) 200 MG/ML injection Inject 1 mL (200 mg total) into the muscle every 14 (fourteen) days. INJECT  INTRAMUSCULAR EVERY 2 WEEKS   Triamcinolone Acetonide (NASACORT AQ NA) Place into the nose. otc   valACYclovir (VALTREX) 1000 MG tablet take 1 tablet by mouth if needed  PHQ 2/9 Scores 07/02/2021 08/28/2020 12/23/2019 04/20/2018  PHQ - 2 Score 0 0 0 0  PHQ- 9 Score - 0 0 0    GAD 7 : Generalized Anxiety Score 07/02/2021 08/28/2020 12/23/2019  Nervous, Anxious, on Edge 0 0 0  Control/stop worrying 0 0 0  Worry too much - different things 0 0 0  Trouble relaxing 0 0 0  Restless 0 0 0  Easily annoyed or irritable 0 0 0  Afraid - awful might happen 0 0 0  Total GAD 7 Score 0 0 0  Anxiety Difficulty Not difficult at all - -    BP Readings from Last 3 Encounters:  07/02/21 124/80  02/01/21 110/60  08/28/20 130/80    Physical Exam Vitals and nursing note reviewed.  HENT:      Head: Normocephalic.     Right Ear: Tympanic membrane, ear canal and external ear normal. There is no impacted cerumen.     Left Ear: Tympanic membrane, ear canal and external ear normal. There is no impacted cerumen.     Nose: Nose normal. No congestion or rhinorrhea.  Eyes:     General: No scleral icterus.       Right eye: No discharge.        Left eye: No discharge.     Extraocular Movements: Extraocular movements intact.     Conjunctiva/sclera: Conjunctivae normal.     Pupils: Pupils are equal, round, and reactive to light.  Neck:     Thyroid: No thyromegaly.     Vascular: No carotid bruit or JVD.     Trachea: No tracheal deviation.  Cardiovascular:     Rate and Rhythm: Normal rate and regular rhythm.     Heart sounds: Normal heart sounds, S1 normal and S2 normal. No murmur heard. No systolic murmur is present.  No diastolic murmur is present.    No friction rub. No gallop. No S3 or S4 sounds.  Pulmonary:     Effort: No respiratory distress.     Breath sounds: Normal breath sounds. No stridor. No wheezing, rhonchi or rales.  Abdominal:     General: Bowel sounds are normal.     Palpations: Abdomen is soft. There is no hepatomegaly, splenomegaly or mass.     Tenderness: There is no abdominal tenderness. There is no guarding or rebound.  Musculoskeletal:        General: No tenderness. Normal range of motion.     Cervical back: Normal range of motion and neck supple.  Lymphadenopathy:     Cervical: No cervical adenopathy.  Skin:    General: Skin is warm.     Findings: No rash.  Neurological:     Mental Status: He is alert and oriented to person, place, and time.     Cranial Nerves: No cranial nerve deficit.     Deep Tendon Reflexes: Reflexes are normal and symmetric.    Wt Readings from Last 3 Encounters:  07/02/21 165 lb (74.8 kg)  02/01/21 168 lb (76.2 kg)  08/28/20 170 lb (77.1 kg)    BP 124/80 (BP Location: Right Arm, Patient Position: Sitting, Cuff Size: Normal)    Pulse 78   Ht 5\' 4"  (1.626 m)   Wt 165 lb (74.8 kg)   SpO2 97%   BMI 28.32 kg/m   Assessment and Plan:  1. Hormone replacement therapy (HRT) Chronic.  Controlled.  Stable.  Patient has been on dosing of 200 mg every 2 weeks of intramuscular Depo testosterone cypionate for  many many years.  He has tolerated this well and will continue to take as so but we will check a trough level testosterone today. - testosterone cypionate (DEPOTESTOSTERONE CYPIONATE) 200 MG/ML injection; Inject 1 mL (200 mg total) into the muscle every 14 (fourteen) days. INJECT  INTRAMUSCULAR EVERY 2 WEEKS  Dispense: 6 mL; Refill: 2 - Testosterone  2. Gastroesophageal reflux disease, unspecified whether esophagitis present Chronic.  Controlled.  Stable.  Continue pantoprazole 40 mg once a day. - pantoprazole (PROTONIX) 40 MG tablet; TAKE 1 TABLET(40 MG) BY MOUTH DAILY  Dispense: 90 tablet; Refill: 1  3. Familial hypercholesterolemia Chronic.  Relatively controlled.  Patient was out of medication and just restarted within the last 24 hours.  We will check an LDL but not respond accordingly if it is still elevated and that he is only been back on the Zetia for the past 24 hours. - ezetimibe (ZETIA) 10 MG tablet; TAKE 1 TABLET(10 MG) BY MOUTH DAILY  Dispense: 90 tablet; Refill: 1 - Direct LDL

## 2021-07-03 LAB — LDL CHOLESTEROL, DIRECT: LDL Direct: 155 mg/dL — ABNORMAL HIGH (ref 0–99)

## 2021-07-03 LAB — TESTOSTERONE: Testosterone: 279 ng/dL (ref 264–916)

## 2021-08-03 ENCOUNTER — Other Ambulatory Visit: Payer: Self-pay | Admitting: Family Medicine

## 2021-08-03 DIAGNOSIS — K219 Gastro-esophageal reflux disease without esophagitis: Secondary | ICD-10-CM

## 2021-08-29 ENCOUNTER — Ambulatory Visit: Payer: Self-pay | Admitting: Family Medicine

## 2021-10-03 ENCOUNTER — Other Ambulatory Visit: Payer: Self-pay | Admitting: Family Medicine

## 2021-10-03 DIAGNOSIS — E7801 Familial hypercholesterolemia: Secondary | ICD-10-CM

## 2021-10-03 NOTE — Telephone Encounter (Signed)
Requested Prescriptions  Pending Prescriptions Disp Refills  . ezetimibe (ZETIA) 10 MG tablet [Pharmacy Med Name: EZETIMIBE 10MG  TABLETS] 90 tablet 1    Sig: TAKE 1 TABLET(10 MG) BY MOUTH DAILY     Cardiovascular:  Antilipid - Sterol Transport Inhibitors Failed - 10/03/2021  5:59 PM      Failed - Total Cholesterol in normal range and within 360 days    Cholesterol, Total  Date Value Ref Range Status  02/01/2021 221 (H) 100 - 199 mg/dL Final         Failed - LDL in normal range and within 360 days    LDL Chol Calc (NIH)  Date Value Ref Range Status  02/01/2021 159 (H) 0 - 99 mg/dL Final   LDL Direct  Date Value Ref Range Status  07/02/2021 155 (H) 0 - 99 mg/dL Final         Passed - HDL in normal range and within 360 days    HDL  Date Value Ref Range Status  02/01/2021 44 >39 mg/dL Final         Passed - Triglycerides in normal range and within 360 days    Triglycerides  Date Value Ref Range Status  02/01/2021 99 0 - 149 mg/dL Final         Passed - Valid encounter within last 12 months    Recent Outpatient Visits          3 months ago Hormone replacement therapy (HRT)   [facility] 04/03/2021, MD   8 months ago Familial hypercholesterolemia   [facility] Duanne Limerick, MD   1 year ago Hormone replacement therapy (HRT)   [facility] Duanne Limerick, MD   1 year ago Hormone replacement therapy (HRT)   [facility] Duanne Limerick, MD   2 years ago Hormone replacement therapy (HRT)   [facility] Duanne Limerick, MD      Future Appointments            In 3 months Duanne Limerick, MD [facility], [facility]

## 2021-11-01 ENCOUNTER — Other Ambulatory Visit: Payer: Self-pay

## 2021-11-01 MED ORDER — SYRINGE (DISPOSABLE) 3 ML MISC: 1.0000 | 2 refills | Status: DC

## 2021-11-15 ENCOUNTER — Ambulatory Visit: Payer: Self-pay

## 2021-11-15 NOTE — Telephone Encounter (Signed)
°  Chief Complaint: diarrhea Symptoms: nausea, diarrhea, loss of appetite, stomach cramping Frequency: 4-5 days  Pertinent Negatives: Patient denies fever Disposition: [] ED /[] Urgent Care (no appt availability in office) / [x] Appointment(In office/virtual)/ []  [facility]/ [] Home Care/ [] Refused Recommended Disposition  Additional Notes: Pt says that he's taking imodium and its not helping with the diarrhea. He woke up this morning feeling fine but still having symptoms again. Is unsure if he has virus or Salmonella from eating fresh farm eggs. Asked pt about hydration status. Pt says that he is urinating less but doesn't feel like IV fluids are necessary at this point. Wants to come in to office to be seen. Appt was scheduled for tomorrow morning at 0840. Advised pt to continue eating bland foods and drinking plenty of fluids. Pt verbalized understanding.    Reason for Disposition  [1] MODERATE diarrhea (e.g., 4-6 times / day more than normal) AND [2] present > 48 hours (2 days)  Answer Assessment - Initial Assessment Questions 1. DIARRHEA SEVERITY: "How bad is the diarrhea?" "How many more stools have you had in the past 24 hours than normal?"    - NO DIARRHEA (SCALE 0)   - MILD (SCALE 1-3): Few loose or mushy BMs; increase of 1-3 stools over normal daily number of stools; mild increase in ostomy output.   -  MODERATE (SCALE 4-7): Increase of 4-6 stools daily over normal; moderate increase in ostomy output. * SEVERE (SCALE 8-10; OR 'WORST POSSIBLE'): Increase of 7 or more stools daily over normal; moderate increase in ostomy output; incontinence.     6-7 2. ONSET: "When did the diarrhea begin?"      Monday evening 3. BM CONSISTENCY: "How loose or watery is the diarrhea?"      water 4. VOMITING: "Are you also vomiting?" If Yes, ask: "How many times in the past 24 hours?"      No 5. ABDOMINAL PAIN: "Are you having any abdominal pain?" If Yes, ask: "What does it feel like?" (e.g.,  crampy, dull, intermittent, constant)      Cramping and upset 6. ABDOMINAL PAIN SEVERITY: If present, ask: "How bad is the pain?"  (e.g., Scale 1-10; mild, moderate, or severe)   - MILD (1-3): doesn't interfere with normal activities, abdomen soft and not tender to touch    - MODERATE (4-7): interferes with normal activities or awakens from sleep, abdomen tender to touch    - SEVERE (8-10): excruciating pain, doubled over, unable to do any normal activities       mild 7. ORAL INTAKE: If vomiting, "Have you been able to drink liquids?" "How much liquids have you had in the past 24 hours?"     No vomiting 8. HYDRATION: "Any signs of dehydration?" (e.g., dry mouth [not just dry lips], too weak to stand, dizziness, new weight loss) "When did you last urinate?"    Slight dehydration 9. EXPOSURE: "Have you traveled to a foreign country recently?" "Have you been exposed to anyone with diarrhea?" "Could you have eaten any food that was spoiled?"     Ate a microwave breakfast, ate eggs from chicken coop after washing Monday morning 10. ANTIBIOTIC USE: "Are you taking antibiotics now or have you taken antibiotics in the past 2 months?"       No 11. OTHER SYMPTOMS: "Do you have any other symptoms?" (e.g., fever, blood in stool)       Nausea, loss appetite  Protocols used: Diarrhea-A-AH

## 2021-11-16 ENCOUNTER — Encounter: Payer: Self-pay | Admitting: Family Medicine

## 2021-11-16 ENCOUNTER — Other Ambulatory Visit: Payer: Self-pay

## 2021-11-16 ENCOUNTER — Ambulatory Visit: Payer: 59 | Admitting: Family Medicine

## 2021-11-16 VITALS — BP 106/74 | HR 78 | Temp 98.0°F | Ht 64.0 in | Wt 166.0 lb

## 2021-11-16 DIAGNOSIS — R197 Diarrhea, unspecified: Secondary | ICD-10-CM

## 2021-11-16 NOTE — Progress Notes (Signed)
Date:  11/16/2021   Name:  Rick Mason   DOB:  02/04/1976   MRN:  [id number]   Chief Complaint: Diarrhea (Last time used bathroom was yesterday afternoon)  Diarrhea  This is a new problem. The current episode started in the past 7 days. The problem occurs 5 to 10 times per day. The problem has been gradually improving. The stool consistency is described as Watery. The patient states that diarrhea awakens him from sleep. Associated symptoms include chills, myalgias and sweats. Pertinent negatives include no abdominal pain, coughing, fever, headaches, vomiting or weight loss. Nothing aggravates the symptoms. Risk factors include suspect food intake (fresh eggs). He has tried bismuth subsalicylate and increased fluids for the symptoms. The treatment provided no relief.   Lab Results  Component Value Date   NA 135 02/01/2021   K 4.5 02/01/2021   CO2 21 02/01/2021   GLUCOSE 82 02/01/2021   BUN 8 02/01/2021   CREATININE 1.00 02/01/2021   CALCIUM 9.5 02/01/2021   EGFR 96 02/01/2021   GFRNONAA 106 08/28/2020   Lab Results  Component Value Date   CHOL 221 (H) 02/01/2021   HDL 44 02/01/2021   LDLCALC 159 (H) 02/01/2021   LDLDIRECT 155 (H) 07/02/2021   TRIG 99 02/01/2021   CHOLHDL 4.8 12/08/2018   No results found for: TSH No results found for: HGBA1C Lab Results  Component Value Date   HGB 16.9 06/14/2019   Lab Results  Component Value Date   ALT 32 02/01/2021   AST 23 02/01/2021   ALKPHOS 56 02/01/2021   BILITOT 0.7 02/01/2021   No results found for: 25OHVITD2, 25OHVITD3, VD25OH   Review of Systems  Constitutional:  Positive for chills. Negative for fever and weight loss.  HENT:  Negative for drooling, ear discharge, ear pain and sore throat.   Respiratory:  Negative for cough, shortness of breath and wheezing.   Cardiovascular:  Negative for chest pain, palpitations and leg swelling.  Gastrointestinal:  Positive for diarrhea. Negative for abdominal pain, blood in  stool, constipation, nausea and vomiting.  Endocrine: Negative for polydipsia.  Genitourinary:  Negative for dysuria, frequency, hematuria and urgency.  Musculoskeletal:  Positive for myalgias. Negative for back pain and neck pain.  Skin:  Negative for rash.  Allergic/Immunologic: Negative for environmental allergies.  Neurological:  Negative for dizziness and headaches.  Hematological:  Does not bruise/bleed easily.  Psychiatric/Behavioral:  Negative for suicidal ideas. The patient is not nervous/anxious.    Patient Active Problem List   Diagnosis Date Noted   Stomatitis 04/20/2018   Cervical radiculopathy 04/20/2018   Hormone replacement therapy (HRT) 04/20/2018   Viral syndrome 04/20/2018   Hypogonadism male 05/01/2017   Mixed hyperlipidemia 05/01/2017   Taking medication for chronic disease 05/01/2017    Allergies  Allergen Reactions   Penicillins     Past Surgical History:  Procedure Laterality Date   MASTECTOMY Bilateral     Social History   Tobacco Use   Smoking status: Former    Types: Cigarettes    Quit date: 01/21/2015    Years since quitting: 6.8   Smokeless tobacco: Never  Substance Use Topics   Alcohol use: No    Alcohol/week: 0.0 standard drinks   Drug use: No     Medication list has been reviewed and updated.  Current Meds  Medication Sig   cetirizine (ZYRTEC) 10 MG tablet TAKE 1 TABLET(10 MG) BY MOUTH DAILY   ezetimibe (ZETIA) 10 MG tablet TAKE 1 TABLET(10 MG) BY  MOUTH DAILY   ketoconazole (NIZORAL) 2 % cream Apply 1 application topically daily.   pantoprazole (PROTONIX) 40 MG tablet TAKE 1 TABLET(40 MG) BY MOUTH DAILY   Syringe, Disposable, 3 ML MISC 1 each by Does not apply route every 14 (fourteen) days.   testosterone cypionate (DEPOTESTOSTERONE CYPIONATE) 200 MG/ML injection Inject 1 mL (200 mg total) into the muscle every 14 (fourteen) days. INJECT 1ML  INTRAMUSCULAR EVERY 2 WEEKS   Triamcinolone Acetonide (NASACORT AQ NA) Place into the  nose. otc   valACYclovir (VALTREX) 1000 MG tablet take 1 tablet by mouth if needed    PHQ 2/9 Scores 11/16/2021 07/02/2021 08/28/2020 12/23/2019  PHQ - 2 Score 0 0 0 0  PHQ- 9 Score 0 - 0 0    GAD 7 : Generalized Anxiety Score 11/16/2021 07/02/2021 08/28/2020 12/23/2019  Nervous, Anxious, on Edge 0 0 0 0  Control/stop worrying 0 0 0 0  Worry too much - different things 0 0 0 0  Trouble relaxing 0 0 0 0  Restless 0 0 0 0  Easily annoyed or irritable 0 0 0 0  Afraid - awful might happen 0 0 0 0  Total GAD 7 Score 0 0 0 0  Anxiety Difficulty - Not difficult at all - -    BP Readings from Last 3 Encounters:  11/16/21 106/74  07/02/21 124/80  02/01/21 110/60    Physical Exam Vitals and nursing note reviewed.  HENT:     Head: Normocephalic.     Right Ear: Tympanic membrane and external ear normal.     Left Ear: Tympanic membrane and external ear normal.     Nose: Nose normal. No congestion or rhinorrhea.  Eyes:     General: No scleral icterus.       Right eye: No discharge.        Left eye: No discharge.     Conjunctiva/sclera: Conjunctivae normal.     Pupils: Pupils are equal, round, and reactive to light.  Neck:     Thyroid: No thyromegaly.     Vascular: No JVD.     Trachea: No tracheal deviation.  Cardiovascular:     Rate and Rhythm: Normal rate and regular rhythm.     Heart sounds: Normal heart sounds. No murmur heard.   No friction rub. No gallop.  Pulmonary:     Effort: No respiratory distress.     Breath sounds: Normal breath sounds. No stridor. No wheezing, rhonchi or rales.  Abdominal:     General: Bowel sounds are normal.     Palpations: Abdomen is soft. There is no mass.     Tenderness: There is no abdominal tenderness. There is no guarding or rebound.  Musculoskeletal:        General: No tenderness. Normal range of motion.     Cervical back: Normal range of motion and neck supple.  Lymphadenopathy:     Cervical: No cervical adenopathy.  Skin:    General:  Skin is warm.     Findings: No rash.  Neurological:     Mental Status: He is alert and oriented to person, place, and time.     Cranial Nerves: No cranial nerve deficit.     Motor: No weakness.     Deep Tendon Reflexes: Reflexes are normal and symmetric.    Wt Readings from Last 3 Encounters:  11/16/21 166 lb (75.3 kg)  07/02/21 165 lb (74.8 kg)  02/01/21 168 lb (76.2 kg)    BP 106/74  Pulse 78    Temp 98 F (36.7 C) (Oral)    Ht _0  (1.626 m)    Wt 166 lb (75.3 kg)    SpO2 98%    BMI 28.49 kg/m   Assessment and Plan:  1. Diarrhea of presumed infectious origin New onset.  Currently improving.  Relatively stable with no orthostatic dizziness.  Patient had 3 to 4 days of watery diarrhea [place] and eaten "farm fresh eggs ".  There is a concern for Salmonella as well as O&P so we will place order in and since he is not had diarrhea over a 12-hour period within a watch and just treat with electrolyte fluids.  If diarrhea is to resume patient is to deliver the filled containers to a nearby at [facility] in [place] including C. difficile/stool culture/O&P and continue with hydration and Imodium. - Cdiff NAA+O+P+Stool Culture

## 2021-11-19 ENCOUNTER — Telehealth: Payer: Self-pay | Admitting: Family Medicine

## 2021-11-19 NOTE — Telephone Encounter (Signed)
Noted  KP 

## 2021-11-19 NOTE — Telephone Encounter (Signed)
Copied from CRM [id number]. Topic: General - Other >> Nov 16, 2021 12:15 PM Pawlus, Rick Mason wrote: Reason for CRM: Pt wanted to let Delice Bison know that he dropped off his specimen at [facility] in [place] off [address], [address]

## 2021-11-20 LAB — CDIFF NAA+O+P+STOOL CULTURE: E coli, Shiga toxin Assay: NEGATIVE

## 2021-11-21 ENCOUNTER — Other Ambulatory Visit: Payer: Self-pay | Admitting: Family Medicine

## 2021-11-21 DIAGNOSIS — K219 Gastro-esophageal reflux disease without esophagitis: Secondary | ICD-10-CM

## 2021-11-21 NOTE — Telephone Encounter (Signed)
Requested Prescriptions  Pending Prescriptions Disp Refills   cetirizine (ZYRTEC) 10 MG tablet [Pharmacy Med Name: CETIRIZINE 10MG  TABLETS] 90 tablet 2    Sig: TAKE 1 TABLET(10 MG) BY MOUTH DAILY     Ear, Nose, and Throat:  Antihistamines Passed - 11/21/2021  6:34 AM      Passed - Valid encounter within last 12 months    Recent Outpatient Visits          5 days ago Diarrhea of presumed infectious origin   [facility], [facility]   4 months ago Hormone replacement therapy (HRT)   [facility] Duanne Limerick, MD   9 months ago Familial hypercholesterolemia   [facility] Duanne Limerick, MD   1 year ago Hormone replacement therapy (HRT)   [facility] Duanne Limerick, MD   1 year ago Hormone replacement therapy (HRT)   [facility] Duanne Limerick, MD      Future Appointments            In 1 month Duanne Limerick, MD [facility]            pantoprazole (PROTONIX) 40 MG tablet [Pharmacy Med Name: PANTOPRAZOLE 40MG  TABLETS] 90 tablet 1    Sig: TAKE 1 TABLET(40 MG) BY MOUTH DAILY     Gastroenterology: Proton Pump Inhibitors Passed - 11/21/2021  6:34 AM      Passed - Valid encounter within last 12 months    Recent Outpatient Visits          5 days ago Diarrhea of presumed infectious origin   [facility] 11/23/2021, MD   4 months ago Hormone replacement therapy (HRT)   [facility], MD   9 months ago Familial hypercholesterolemia   [facility] Duanne Limerick, MD   1 year ago Hormone replacement therapy (HRT)   [facility] Duanne Limerick, MD   1 year ago Hormone replacement therapy (HRT)   [facility] Duanne Limerick, MD      Future Appointments            In 1 month Duanne Limerick, MD [facility], [facility]

## 2022-01-03 ENCOUNTER — Ambulatory Visit: Payer: 59 | Admitting: Family Medicine

## 2022-01-14 ENCOUNTER — Ambulatory Visit: Payer: 59 | Admitting: Family Medicine

## 2022-01-16 ENCOUNTER — Other Ambulatory Visit: Payer: Self-pay | Admitting: Family Medicine

## 2022-01-16 DIAGNOSIS — Z79890 Hormone replacement therapy (postmenopausal): Secondary | ICD-10-CM

## 2022-01-16 DIAGNOSIS — E7801 Familial hypercholesterolemia: Secondary | ICD-10-CM

## 2022-01-16 NOTE — Telephone Encounter (Signed)
Pt was following up on this refill request, please advise.

## 2022-01-22 ENCOUNTER — Ambulatory Visit (INDEPENDENT_AMBULATORY_CARE_PROVIDER_SITE_OTHER): Payer: 59 | Admitting: Family Medicine

## 2022-01-22 ENCOUNTER — Encounter: Payer: Self-pay | Admitting: Family Medicine

## 2022-01-22 ENCOUNTER — Other Ambulatory Visit: Payer: Self-pay

## 2022-01-22 VITALS — BP 120/78 | HR 72 | Ht 64.0 in | Wt 168.0 lb

## 2022-01-22 DIAGNOSIS — Z79890 Hormone replacement therapy (postmenopausal): Secondary | ICD-10-CM | POA: Diagnosis not present

## 2022-01-22 DIAGNOSIS — E7801 Familial hypercholesterolemia: Secondary | ICD-10-CM | POA: Diagnosis not present

## 2022-01-22 DIAGNOSIS — J301 Allergic rhinitis due to pollen: Secondary | ICD-10-CM

## 2022-01-22 DIAGNOSIS — K219 Gastro-esophageal reflux disease without esophagitis: Secondary | ICD-10-CM

## 2022-01-22 MED ORDER — PANTOPRAZOLE SODIUM 40 MG PO TBEC: DELAYED_RELEASE_TABLET | ORAL | 1 refills | Status: DC

## 2022-01-22 MED ORDER — EZETIMIBE 10 MG PO TABS: ORAL_TABLET | ORAL | 1 refills | Status: DC

## 2022-01-22 MED ORDER — CETIRIZINE HCL 10 MG PO TABS: ORAL_TABLET | ORAL | 1 refills | Status: DC

## 2022-01-22 MED ORDER — TESTOSTERONE CYPIONATE 200 MG/ML IM SOLN: 200.0000 mg | INTRAMUSCULAR | 1 refills | Status: DC

## 2022-01-22 NOTE — Progress Notes (Signed)
Date:  01/22/2022   Name:  Rick Mason   DOB:  12-26-1975   MRN:  [id number]   Chief Complaint: hormone replacement therapy, Gastroesophageal Reflux, Hyperlipidemia, and Allergic Rhinitis   Patient is a 46 year old male who presents for a HRT exam. The patient reports the following problems: none. Health maintenance has been reviewed up to date.    Gastroesophageal Reflux He reports no abdominal pain, no chest pain, no coughing, no nausea, no sore throat or no wheezing.  Hyperlipidemia Pertinent negatives include no chest pain, myalgias or shortness of breath.   Lab Results  Component Value Date   NA 135 02/01/2021   K 4.5 02/01/2021   CO2 21 02/01/2021   GLUCOSE 82 02/01/2021   BUN 8 02/01/2021   CREATININE 1.00 02/01/2021   CALCIUM 9.5 02/01/2021   EGFR 96 02/01/2021   GFRNONAA 106 08/28/2020   Lab Results  Component Value Date   CHOL 221 (H) 02/01/2021   HDL 44 02/01/2021   LDLCALC 159 (H) 02/01/2021   LDLDIRECT 155 (H) 07/02/2021   TRIG 99 02/01/2021   CHOLHDL 4.8 12/08/2018   No results found for: TSH No results found for: HGBA1C Lab Results  Component Value Date   HGB 16.9 06/14/2019   Lab Results  Component Value Date   ALT 32 02/01/2021   AST 23 02/01/2021   ALKPHOS 56 02/01/2021   BILITOT 0.7 02/01/2021   No results found for: 25OHVITD2, 25OHVITD3, VD25OH   Review of Systems  Constitutional:  Negative for chills and fever.  HENT:  Negative for drooling, ear discharge, ear pain and sore throat.   Respiratory:  Negative for cough, shortness of breath and wheezing.   Cardiovascular:  Negative for chest pain, palpitations and leg swelling.  Gastrointestinal:  Positive for diarrhea. Negative for abdominal pain, blood in stool, constipation and nausea.  Endocrine: Negative for polydipsia.  Genitourinary:  Negative for dysuria, frequency, hematuria and urgency.  Musculoskeletal:  Negative for back pain, myalgias and neck pain.  Skin:  Negative for  rash.  Allergic/Immunologic: Negative for environmental allergies.  Neurological:  Negative for dizziness and headaches.  Hematological:  Does not bruise/bleed easily.  Psychiatric/Behavioral:  Negative for suicidal ideas. The patient is not nervous/anxious.    Patient Active Problem List   Diagnosis Date Noted   Stomatitis 04/20/2018   Cervical radiculopathy 04/20/2018   Hormone replacement therapy (HRT) 04/20/2018   Viral syndrome 04/20/2018   Hypogonadism male 05/01/2017   Mixed hyperlipidemia 05/01/2017   Taking medication for chronic disease 05/01/2017    Allergies  Allergen Reactions   Penicillins     Past Surgical History:  Procedure Laterality Date   MASTECTOMY Bilateral     Social History   Tobacco Use   Smoking status: Former    Types: Cigarettes    Quit date: 01/21/2015    Years since quitting: 7.0   Smokeless tobacco: Never  Substance Use Topics   Alcohol use: No    Alcohol/week: 0.0 standard drinks   Drug use: No     Medication list has been reviewed and updated.  Current Meds  Medication Sig   cetirizine (ZYRTEC) 10 MG tablet TAKE 1 TABLET(10 MG) BY MOUTH DAILY   ezetimibe (ZETIA) 10 MG tablet TAKE 1 TABLET(10 MG) BY MOUTH DAILY   ketoconazole (NIZORAL) 2 % cream Apply 1 application topically daily.   pantoprazole (PROTONIX) 40 MG tablet TAKE 1 TABLET(40 MG) BY MOUTH DAILY   Syringe, Disposable, 3 ML MISC 1  each by Does not apply route every 14 (fourteen) days.   testosterone cypionate (DEPOTESTOSTERONE CYPIONATE) 200 MG/ML injection Inject 1 mL (200 mg total) into the muscle every 14 (fourteen) days. INJECT 1ML  INTRAMUSCULAR EVERY 2 WEEKS   Triamcinolone Acetonide (NASACORT AQ NA) Place into the nose. otc   valACYclovir (VALTREX) 1000 MG tablet take 1 tablet by mouth if needed    PHQ 2/9 Scores 01/22/2022 11/16/2021 07/02/2021 08/28/2020  PHQ - 2 Score 0 0 0 0  PHQ- 9 Score 0 0 - 0    GAD 7 : Generalized Anxiety Score 01/22/2022 11/16/2021  07/02/2021 08/28/2020  Nervous, Anxious, on Edge 0 0 0 0  Control/stop worrying 0 0 0 0  Worry too much - different things 0 0 0 0  Trouble relaxing 0 0 0 0  Restless 0 0 0 0  Easily annoyed or irritable 0 0 0 0  Afraid - awful might happen 0 0 0 0  Total GAD 7 Score 0 0 0 0  Anxiety Difficulty Not difficult at all - Not difficult at all -    BP Readings from Last 3 Encounters:  01/22/22 120/78  11/16/21 106/74  07/02/21 124/80    Physical Exam Vitals and nursing note reviewed.  HENT:     Head: Normocephalic.     Right Ear: Tympanic membrane, ear canal and external ear normal. There is no impacted cerumen.     Left Ear: Tympanic membrane, ear canal and external ear normal. There is no impacted cerumen.     Nose: Nose normal. No congestion or rhinorrhea.  Eyes:     General: No scleral icterus.       Right eye: No discharge.        Left eye: No discharge.     Conjunctiva/sclera: Conjunctivae normal.     Pupils: Pupils are equal, round, and reactive to light.  Neck:     Thyroid: No thyromegaly.     Vascular: No JVD.     Trachea: No tracheal deviation.  Cardiovascular:     Rate and Rhythm: Normal rate and regular rhythm.     Heart sounds: Normal heart sounds. No murmur heard.   No friction rub. No gallop.  Pulmonary:     Effort: No respiratory distress.     Breath sounds: Normal breath sounds. No wheezing, rhonchi or rales.  Abdominal:     General: Bowel sounds are normal.     Palpations: Abdomen is soft. There is no mass.     Tenderness: There is no abdominal tenderness. There is no guarding or rebound.  Musculoskeletal:        General: No tenderness. Normal range of motion.     Cervical back: Normal range of motion and neck supple.  Lymphadenopathy:     Cervical: No cervical adenopathy.  Skin:    General: Skin is warm.     Findings: No rash.  Neurological:     Mental Status: He is alert and oriented to person, place, and time.     Cranial Nerves: No cranial nerve  deficit.     Deep Tendon Reflexes: Reflexes are normal and symmetric.    Wt Readings from Last 3 Encounters:  01/22/22 168 lb (76.2 kg)  11/16/21 166 lb (75.3 kg)  07/02/21 165 lb (74.8 kg)    BP 120/78    Pulse 72    Ht _0  (1.626 m)    Wt 168 lb (76.2 kg)    BMI 28.84 kg/m  Assessment and Plan:  1. Hormone replacement therapy (HRT) Chronic.  Controlled.  Stable.  Continue Depo testosterone cypionate 1 mL which is 200 mg every 2 weeks.  We will recheck in 6 months.  Will evaluate electrolytes and GFR with CMP.  We will also address transaminases if necessary. - testosterone cypionate (DEPOTESTOSTERONE CYPIONATE) 200 MG/ML injection; Inject 1 mL (200 mg total) into the muscle every 14 (fourteen) days. INJECT 1ML  INTRAMUSCULAR EVERY 2 WEEKS  Dispense: 6 mL; Refill: 1 - Comprehensive Metabolic Panel (CMET)  2. Familial hypercholesterolemia Chronic.  Controlled.  Stable.  Continue Zetia 10 mg once a day.  Will check lipid panel for current status of LDL. - ezetimibe (ZETIA) 10 MG tablet; Take 1 tab daily  Dispense: 90 tablet; Refill: 1 - Lipid Panel With LDL/HDL Ratio  3. Gastroesophageal reflux disease, unspecified whether esophagitis present Chronic.  Controlled.  Stable.  Continue pantoprazole 40 mg once a day. - pantoprazole (PROTONIX) 40 MG tablet; Take 1 tablet by mouth daily  Dispense: 90 tablet; Refill: 1  4. Seasonal allergic rhinitis due to pollen Chronic.  Controlled.  Stable.  Pollen count is elevated into the moderate to high range with particularly cedar and a.m. as the predominant pollen concern.  Continue Zyrtec 10 mg once a day. - cetirizine (ZYRTEC) 10 MG tablet; TAKE 1 TABLET(10 MG) BY MOUTH DAILY  Dispense: 90 tablet; Refill: 1

## 2022-01-23 LAB — COMPREHENSIVE METABOLIC PANEL
ALT: 50 IU/L — ABNORMAL HIGH (ref 0–44)
AST: 28 IU/L (ref 0–40)
Albumin/Globulin Ratio: 2.2 (ref 1.2–2.2)
Albumin: 4.7 g/dL (ref 4.0–5.0)
Alkaline Phosphatase: 63 IU/L (ref 44–121)
BUN/Creatinine Ratio: 10 (ref 9–20)
BUN: 12 mg/dL (ref 6–24)
Bilirubin Total: 0.6 mg/dL (ref 0.0–1.2)
CO2: 25 mmol/L (ref 20–29)
Calcium: 9.1 mg/dL (ref 8.7–10.2)
Chloride: 99 mmol/L (ref 96–106)
Creatinine, Ser: 1.21 mg/dL (ref 0.76–1.27)
Globulin, Total: 2.1 g/dL (ref 1.5–4.5)
Glucose: 81 mg/dL (ref 70–99)
Potassium: 4.8 mmol/L (ref 3.5–5.2)
Sodium: 137 mmol/L (ref 134–144)
Total Protein: 6.8 g/dL (ref 6.0–8.5)
eGFR: 76 mL/min/{1.73_m2} (ref >59)

## 2022-01-23 LAB — LIPID PANEL WITH LDL/HDL RATIO
Cholesterol, Total: 140 mg/dL (ref 100–199)
HDL: 27 mg/dL — ABNORMAL LOW (ref >39)
LDL Chol Calc (NIH): 97 mg/dL (ref 0–99)
LDL/HDL Ratio: 3.6 ratio (ref 0.0–3.6)
Triglycerides: 83 mg/dL (ref 0–149)
VLDL Cholesterol Cal: 16 mg/dL (ref 5–40)

## 2022-04-30 ENCOUNTER — Other Ambulatory Visit: Payer: Self-pay | Admitting: Family Medicine

## 2022-04-30 DIAGNOSIS — K121 Other forms of stomatitis: Secondary | ICD-10-CM

## 2022-04-30 DIAGNOSIS — B349 Viral infection, unspecified: Secondary | ICD-10-CM

## 2022-07-22 ENCOUNTER — Ambulatory Visit: Payer: 59 | Admitting: Family Medicine

## 2022-07-22 ENCOUNTER — Encounter: Payer: Self-pay | Admitting: Family Medicine

## 2022-07-22 VITALS — BP 110/80 | HR 80 | Ht 64.0 in | Wt 183.0 lb

## 2022-07-22 DIAGNOSIS — K219 Gastro-esophageal reflux disease without esophagitis: Secondary | ICD-10-CM

## 2022-07-22 DIAGNOSIS — Z79890 Hormone replacement therapy (postmenopausal): Secondary | ICD-10-CM

## 2022-07-22 DIAGNOSIS — B349 Viral infection, unspecified: Secondary | ICD-10-CM

## 2022-07-22 DIAGNOSIS — E7801 Familial hypercholesterolemia: Secondary | ICD-10-CM

## 2022-07-22 DIAGNOSIS — K121 Other forms of stomatitis: Secondary | ICD-10-CM

## 2022-07-22 DIAGNOSIS — J301 Allergic rhinitis due to pollen: Secondary | ICD-10-CM | POA: Diagnosis not present

## 2022-07-22 MED ORDER — TESTOSTERONE CYPIONATE 200 MG/ML IM SOLN: 200.0000 mg | INTRAMUSCULAR | 1 refills | Status: DC

## 2022-07-22 MED ORDER — EZETIMIBE 10 MG PO TABS: ORAL_TABLET | ORAL | 1 refills | Status: DC

## 2022-07-22 MED ORDER — CETIRIZINE HCL 10 MG PO TABS: ORAL_TABLET | ORAL | 1 refills | Status: DC

## 2022-07-22 MED ORDER — VALACYCLOVIR HCL 1 G PO TABS: ORAL_TABLET | ORAL | 1 refills | Status: DC

## 2022-07-22 MED ORDER — PANTOPRAZOLE SODIUM 40 MG PO TBEC: DELAYED_RELEASE_TABLET | ORAL | 1 refills | Status: DC

## 2022-07-22 NOTE — Progress Notes (Signed)
Date:  07/22/2022   Name:  Rick Mason   DOB:  Nov 16, 1976   MRN:  [id number]   Chief Complaint: Gastroesophageal Reflux, Hyperlipidemia, Allergic Rhinitis , viral syndrome, and hormone replacement therapy  Gastroesophageal Reflux He reports no abdominal pain, no belching, no chest pain, no choking, no coughing, no dysphagia, no early satiety, no globus sensation, no heartburn, no hoarse voice, no nausea, no sore throat, no stridor, no tooth decay, no water brash or no wheezing. This is a chronic problem. The current episode started more than 1 year ago. The problem has been gradually improving. He has tried a PPI for the symptoms. The treatment provided moderate relief.  Hyperlipidemia This is a chronic problem. The current episode started more than 1 year ago. The problem is controlled. Recent lipid tests were reviewed and are normal. He has no history of chronic renal disease, diabetes, hypothyroidism, liver disease, obesity or nephrotic syndrome. Pertinent negatives include no chest pain, focal sensory loss, focal weakness, leg pain, myalgias or shortness of breath. Current antihyperlipidemic treatment includes statins. The current treatment provides moderate improvement of lipids. There are no compliance problems.  Risk factors for coronary artery disease include dyslipidemia.    Lab Results  Component Value Date   NA 137 01/22/2022   K 4.8 01/22/2022   CO2 25 01/22/2022   GLUCOSE 81 01/22/2022   BUN 12 01/22/2022   CREATININE 1.21 01/22/2022   CALCIUM 9.1 01/22/2022   EGFR 76 01/22/2022   GFRNONAA 106 08/28/2020   Lab Results  Component Value Date   CHOL 140 01/22/2022   HDL 27 (L) 01/22/2022   LDLCALC 97 01/22/2022   LDLDIRECT 155 (H) 07/02/2021   TRIG 83 01/22/2022   CHOLHDL 4.8 12/08/2018   No results found for: "TSH" No results found for: "HGBA1C" Lab Results  Component Value Date   HGB 16.9 06/14/2019   Lab Results  Component Value Date   ALT 50 (H) 01/22/2022    AST 28 01/22/2022   ALKPHOS 63 01/22/2022   BILITOT 0.6 01/22/2022   No results found for: "25OHVITD2", "25OHVITD3", "VD25OH"   Review of Systems  Constitutional:  Negative for chills and fever.  HENT:  Negative for drooling, ear discharge, ear pain, hoarse voice and sore throat.   Respiratory:  Negative for cough, choking, shortness of breath and wheezing.   Cardiovascular:  Negative for chest pain, palpitations and leg swelling.  Gastrointestinal:  Negative for abdominal pain, blood in stool, constipation, diarrhea, dysphagia, heartburn and nausea.  Endocrine: Negative for polydipsia.  Genitourinary:  Negative for dysuria, frequency, hematuria and urgency.  Musculoskeletal:  Negative for back pain, myalgias and neck pain.  Skin:  Negative for rash.  Allergic/Immunologic: Negative for environmental allergies.  Neurological:  Negative for dizziness, focal weakness and headaches.  Hematological:  Does not bruise/bleed easily.  Psychiatric/Behavioral:  Negative for suicidal ideas. The patient is not nervous/anxious.     Patient Active Problem List   Diagnosis Date Noted   Stomatitis 04/20/2018   Cervical radiculopathy 04/20/2018   Hormone replacement therapy (HRT) 04/20/2018   Viral syndrome 04/20/2018   Hypogonadism male 05/01/2017   Mixed hyperlipidemia 05/01/2017   Taking medication for chronic disease 05/01/2017    Allergies  Allergen Reactions   Penicillins     Past Surgical History:  Procedure Laterality Date   MASTECTOMY Bilateral     Social History   Tobacco Use   Smoking status: Former    Types: Cigarettes    Quit date: 01/21/2015  Years since quitting: 7.5   Smokeless tobacco: Never  Substance Use Topics   Alcohol use: No    Alcohol/week: 0.0 standard drinks of alcohol   Drug use: No     Medication list has been reviewed and updated.  Current Meds  Medication Sig   cetirizine (ZYRTEC) 10 MG tablet TAKE 1 TABLET(10 MG) BY MOUTH DAILY    ezetimibe (ZETIA) 10 MG tablet Take 1 tab daily   ketoconazole (NIZORAL) 2 % cream Apply 1 application topically daily.   pantoprazole (PROTONIX) 40 MG tablet Take 1 tablet by mouth daily   Syringe, Disposable, 3 ML MISC 1 each by Does not apply route every 14 (fourteen) days.   testosterone cypionate (DEPOTESTOSTERONE CYPIONATE) 200 MG/ML injection Inject 1 mL (200 mg total) into the muscle every 14 (fourteen) days. INJECT 1ML  INTRAMUSCULAR EVERY 2 WEEKS   Triamcinolone Acetonide (NASACORT AQ NA) Place into the nose. otc   valACYclovir (VALTREX) 1000 MG tablet TAKE 1 TABLET BY MOUTH AS NEEDED       07/22/2022   10:37 AM 01/22/2022   11:10 AM 11/16/2021    8:53 AM 07/02/2021    1:35 PM  GAD 7 : Generalized Anxiety Score  Nervous, Anxious, on Edge 0 0 0 0  Control/stop worrying 0 0 0 0  Worry too much - different things 0 0 0 0  Trouble relaxing 0 0 0 0  Restless 0 0 0 0  Easily annoyed or irritable 0 0 0 0  Afraid - awful might happen 0 0 0 0  Total GAD 7 Score 0 0 0 0  Anxiety Difficulty Not difficult at all Not difficult at all  Not difficult at all       07/22/2022   10:37 AM 01/22/2022   11:10 AM 11/16/2021    8:53 AM  Depression screen PHQ 2/9  Decreased Interest 0 0 0  Down, Depressed, Hopeless 0 0 0  PHQ - 2 Score 0 0 0  Altered sleeping 0 0 0  Tired, decreased energy 1 0 0  Change in appetite 0 0 0  Feeling bad or failure about yourself  0 0 0  Trouble concentrating 0 0 0  Moving slowly or fidgety/restless 0 0 0  Suicidal thoughts 0 0 0  PHQ-9 Score 1 0 0  Difficult doing work/chores Not difficult at all Not difficult at all     BP Readings from Last 3 Encounters:  07/22/22 110/80  01/22/22 120/78  11/16/21 106/74    Physical Exam Vitals and nursing note reviewed.  HENT:     Head: Normocephalic.     Right Ear: Tympanic membrane and external ear normal.     Left Ear: Tympanic membrane and external ear normal.     Nose: Nose normal. No congestion.      Mouth/Throat:     Mouth: Mucous membranes are moist.  Eyes:     General: No scleral icterus.       Right eye: No discharge.        Left eye: No discharge.     Conjunctiva/sclera: Conjunctivae normal.     Pupils: Pupils are equal, round, and reactive to light.  Neck:     Thyroid: No thyromegaly.     Vascular: No JVD.     Trachea: No tracheal deviation.  Cardiovascular:     Rate and Rhythm: Normal rate and regular rhythm.     Heart sounds: Normal heart sounds. No murmur heard.    No friction  rub. No gallop.  Pulmonary:     Effort: No respiratory distress.     Breath sounds: Normal breath sounds. No wheezing, rhonchi or rales.  Abdominal:     General: Bowel sounds are normal.     Palpations: Abdomen is soft. There is no mass.     Tenderness: There is no abdominal tenderness. There is no guarding or rebound.  Musculoskeletal:        General: No tenderness. Normal range of motion.     Cervical back: Normal range of motion and neck supple.  Lymphadenopathy:     Cervical: No cervical adenopathy.  Skin:    General: Skin is warm.     Findings: No rash.  Neurological:     Mental Status: He is alert and oriented to person, place, and time.     Cranial Nerves: No cranial nerve deficit.     Deep Tendon Reflexes: Reflexes are normal and symmetric.     Wt Readings from Last 3 Encounters:  07/22/22 183 lb (83 kg)  01/22/22 168 lb (76.2 kg)  11/16/21 166 lb (75.3 kg)    BP 110/80   Pulse 80   Ht _0  (1.626 m)   Wt 183 lb (83 kg)   BMI 31.41 kg/m   Assessment and Plan: 1. Hormone replacement therapy (HRT) Chronic.  Controlled.  Stable.  Asymptomatic.  Tolerating medication very well.  Continue testosterone cypionate 200 mg every 14 days.  We will recheck in 6 months.  Review of previous LFTs in February are acceptable. - testosterone cypionate (DEPOTESTOSTERONE CYPIONATE) 200 MG/ML injection; Inject 1 mL (200 mg total) into the muscle every 14 (fourteen) days. INJECT 1ML   INTRAMUSCULAR EVERY 2 WEEKS  Dispense: 6 mL; Refill: 1  2. Familial hypercholesterolemia .  Controlled.  Stable.  Continue Zetia 10 mg once daily - ezetimibe (ZETIA) 10 MG tablet; Take 1 tab daily  Dispense: 90 tablet; Refill: 1  3. Gastroesophageal reflux disease, unspecified whether esophagitis present Chronic.  Controlled.  Stable.  Continue pantoprazole 40 mg once a day. - pantoprazole (PROTONIX) 40 MG tablet; Take 1 tablet by mouth daily  Dispense: 90 tablet; Refill: 1  4. Seasonal allergic rhinitis due to pollen Chronic.  Controlled.  Stable.  Allergies are well controlled with Zyrtec 1 tablet daily. - cetirizine (ZYRTEC) 10 MG tablet; TAKE 1 TABLET(10 MG) BY MOUTH DAILY  Dispense: 90 tablet; Refill: 1  5. Viral syndrome Currently stable on an as-needed basis.  .  Continue Valtrex on an as-needed basis 1 g twice a day for 1 day and then repeat 1 daily as needed. - valACYclovir (VALTREX) 1000 MG tablet; TAKE 1 TABLET BY MOUTH AS NEEDED  Dispense: 30 tablet; Refill: 1  6. Stomatitis As noted above. - valACYclovir (VALTREX) 1000 MG tablet; TAKE 1 TABLET BY MOUTH AS NEEDED  Dispense: 30 tablet; Refill: 1     Otilio Miu, MD

## 2022-08-07 ENCOUNTER — Telehealth: Payer: Self-pay | Admitting: Family Medicine

## 2022-08-07 NOTE — Telephone Encounter (Signed)
Patient called in states usually gets 20ml to last him 75months for  testosterone cypionate (DEPOTESTOSTERONE CYPIONATE) 200 MG/ML injection, at pharmacy, but he only receied 2. Please cal lback to discuss.

## 2022-12-06 ENCOUNTER — Other Ambulatory Visit: Payer: Self-pay

## 2022-12-06 DIAGNOSIS — E7801 Familial hypercholesterolemia: Secondary | ICD-10-CM

## 2022-12-06 MED ORDER — EZETIMIBE 10 MG PO TABS: ORAL_TABLET | ORAL | 0 refills | Status: DC

## 2022-12-10 ENCOUNTER — Other Ambulatory Visit: Payer: Self-pay | Admitting: Family Medicine

## 2022-12-10 DIAGNOSIS — E7801 Familial hypercholesterolemia: Secondary | ICD-10-CM

## 2022-12-10 NOTE — Telephone Encounter (Signed)
Medication Refill - Medication: ezetimibe (ZETIA) 10 MG tablet  Has the patient contacted their pharmacy? yes (Agent: If no, request that the patient contact the pharmacy for the refill. If patient does not wish to contact the pharmacy document the reason why and proceed with request.) (Agent: If yes, when and what did the pharmacy advise?)contact pcp  Preferred Pharmacy (with phone number or street name):  [facility], [place] Phone: [phone]  Fax: [phone]     Has the patient been seen for an appointment in the last year OR does the patient have an upcoming appointment? yes  Agent: Please be advised that RX refills may take up to 3 business days. We ask that you follow-up with your pharmacy.

## 2022-12-10 NOTE — Telephone Encounter (Signed)
Unable to refill per protocol, Rx request is too soon. Last refill 12/06/22 for 90 days. Will refuse duplicate request.  Requested Prescriptions  Pending Prescriptions Disp Refills   ezetimibe (ZETIA) 10 MG tablet 90 tablet 0    Sig: Take 1 tab daily     Cardiovascular:  Antilipid - Sterol Transport Inhibitors Failed - 12/10/2022  2:32 PM      Failed - ALT in normal range and within 360 days    ALT  Date Value Ref Range Status  01/22/2022 50 (H) 0 - 44 IU/L Final         Failed - Lipid Panel in normal range within the last 12 months    Cholesterol, Total  Date Value Ref Range Status  01/22/2022 140 100 - 199 mg/dL Final   LDL Chol Calc (NIH)  Date Value Ref Range Status  01/22/2022 97 0 - 99 mg/dL Final   LDL Direct  Date Value Ref Range Status  07/02/2021 155 (H) 0 - 99 mg/dL Final   HDL  Date Value Ref Range Status  01/22/2022 27 (L) >39 mg/dL Final   Triglycerides  Date Value Ref Range Status  01/22/2022 83 0 - 149 mg/dL Final         Passed - AST in normal range and within 360 days    AST  Date Value Ref Range Status  01/22/2022 28 0 - 40 IU/L Final         Passed - Patient is not pregnant      Passed - Valid encounter within last 12 months    Recent Outpatient Visits           4 months ago Hormone replacement therapy (HRT)   [facility] [place], Deanna C, MD   10 months ago Hormone replacement therapy (HRT)   [facility] [place], Deanna C, MD   1 year ago Diarrhea of presumed infectious origin   [facility] [place], Deanna C, MD   1 year ago Hormone replacement therapy (HRT)   [facility] [place], Deanna C, MD   1 year ago Familial hypercholesterolemia   [facility] [place], [place], MD       Future  Appointments             In 1 month Juline Patch, MD [facility] at [facility], [facility]

## 2022-12-13 ENCOUNTER — Telehealth: Payer: Self-pay | Admitting: Family Medicine

## 2022-12-13 NOTE — Telephone Encounter (Signed)
Copied from [place] [phone]. Topic: General - Other >> Dec 13, 2022  2:22 PM Everette C wrote: Reason for CRM: The patient has called to follow up on their previous refill request for ezetimibe (ZETIA) 10 MG tablet [[id number]]  Please contact the patient further when available   The patient will be traveling in the upcoming weeks and has been without their medication for roughly 1 week

## 2023-01-21 ENCOUNTER — Other Ambulatory Visit: Payer: Self-pay

## 2023-01-21 DIAGNOSIS — K219 Gastro-esophageal reflux disease without esophagitis: Secondary | ICD-10-CM

## 2023-01-21 DIAGNOSIS — J301 Allergic rhinitis due to pollen: Secondary | ICD-10-CM

## 2023-01-21 MED ORDER — CETIRIZINE HCL 10 MG PO TABS: ORAL_TABLET | ORAL | 0 refills | Status: DC

## 2023-01-21 MED ORDER — PANTOPRAZOLE SODIUM 40 MG PO TBEC: DELAYED_RELEASE_TABLET | ORAL | 0 refills | Status: DC

## 2023-01-22 ENCOUNTER — Ambulatory Visit: Payer: Self-pay | Admitting: Family Medicine

## 2023-02-10 ENCOUNTER — Ambulatory Visit: Payer: Self-pay | Admitting: Family Medicine

## 2023-02-19 ENCOUNTER — Telehealth: Payer: Self-pay | Admitting: Family Medicine

## 2023-02-19 ENCOUNTER — Ambulatory Visit: Payer: BC Managed Care – PPO | Admitting: Family Medicine

## 2023-02-19 ENCOUNTER — Encounter: Payer: Self-pay | Admitting: Family Medicine

## 2023-02-19 VITALS — BP 118/68 | HR 68 | Ht 64.0 in | Wt 192.0 lb

## 2023-02-19 DIAGNOSIS — Z23 Encounter for immunization: Secondary | ICD-10-CM | POA: Diagnosis not present

## 2023-02-19 DIAGNOSIS — Z79890 Hormone replacement therapy (postmenopausal): Secondary | ICD-10-CM | POA: Diagnosis not present

## 2023-02-19 DIAGNOSIS — R69 Illness, unspecified: Secondary | ICD-10-CM

## 2023-02-19 MED ORDER — "BD DISP NEEDLES 22G X 1-1/2"" MISC": 1.0000 | 0 refills | Status: DC

## 2023-02-19 MED ORDER — "BD DISP NEEDLES 18G X 1-1/2"" MISC": 1.0000 | 0 refills | Status: DC

## 2023-02-19 MED ORDER — TESTOSTERONE CYPIONATE 200 MG/ML IM SOLN: 200.0000 mg | INTRAMUSCULAR | 1 refills | Status: DC

## 2023-02-19 MED ORDER — SYRINGE (DISPOSABLE) 3 ML MISC: 1.0000 | 2 refills | Status: DC

## 2023-02-19 NOTE — Telephone Encounter (Signed)
Left voice mail to talk with patient about moving his appointment.

## 2023-02-19 NOTE — Progress Notes (Signed)
Date:  02/19/2023   Name:  Rick Mason   DOB:  09-02-1976   MRN:  [id number]   Chief Complaint: Flu Vaccine and hormone replacement therapy  Patient is a 47 year old male who presents for a medication refill exam exam. The patient reports the following problems: none. Health maintenance has been reviewed up to date.      Lab Results  Component Value Date   NA 137 01/22/2022   K 4.8 01/22/2022   CO2 25 01/22/2022   GLUCOSE 81 01/22/2022   BUN 12 01/22/2022   CREATININE 1.21 01/22/2022   CALCIUM 9.1 01/22/2022   EGFR 76 01/22/2022   GFRNONAA 106 08/28/2020   Lab Results  Component Value Date   CHOL 140 01/22/2022   HDL 27 (L) 01/22/2022   LDLCALC 97 01/22/2022   LDLDIRECT 155 (H) 07/02/2021   TRIG 83 01/22/2022   CHOLHDL 4.8 12/08/2018   No results found for: "TSH" No results found for: "HGBA1C" Lab Results  Component Value Date   HGB 16.9 06/14/2019   Lab Results  Component Value Date   ALT 50 (H) 01/22/2022   AST 28 01/22/2022   ALKPHOS 63 01/22/2022   BILITOT 0.6 01/22/2022   No results found for: "25OHVITD2", "25OHVITD3", "VD25OH"   Review of Systems  Constitutional:  Negative for chills and fever.  HENT:  Negative for drooling, ear discharge, ear pain and sore throat.   Respiratory:  Negative for cough, shortness of breath and wheezing.   Cardiovascular:  Negative for chest pain, palpitations and leg swelling.  Gastrointestinal:  Negative for abdominal pain, blood in stool, constipation, diarrhea and nausea.  Endocrine: Negative for polydipsia.  Genitourinary:  Negative for dysuria, frequency, hematuria and urgency.  Musculoskeletal:  Negative for back pain, myalgias and neck pain.  Skin:  Negative for rash.  Allergic/Immunologic: Negative for environmental allergies.  Neurological:  Negative for dizziness and headaches.  Hematological:  Does not bruise/bleed easily.  Psychiatric/Behavioral:  Negative for suicidal ideas. The patient is not  nervous/anxious.     Patient Active Problem List   Diagnosis Date Noted   Stomatitis 04/20/2018   Cervical radiculopathy 04/20/2018   Hormone replacement therapy (HRT) 04/20/2018   Viral syndrome 04/20/2018   Hypogonadism male 05/01/2017   Mixed hyperlipidemia 05/01/2017   Taking medication for chronic disease 05/01/2017    Allergies  Allergen Reactions   Penicillins     Past Surgical History:  Procedure Laterality Date   MASTECTOMY Bilateral     Social History   Tobacco Use   Smoking status: Former    Types: Cigarettes    Quit date: 01/21/2015    Years since quitting: 8.0   Smokeless tobacco: Never  Substance Use Topics   Alcohol use: No    Alcohol/week: 0.0 standard drinks of alcohol   Drug use: No     Medication list has been reviewed and updated.  Current Meds  Medication Sig   cetirizine (ZYRTEC) 10 MG tablet TAKE 1 TABLET(10 MG) BY MOUTH DAILY   ezetimibe (ZETIA) 10 MG tablet Take 1 tab daily   ketoconazole (NIZORAL) 2 % cream Apply 1 application topically daily.   pantoprazole (PROTONIX) 40 MG tablet Take 1 tablet by mouth daily   Syringe, Disposable, 3 ML MISC 1 each by Does not apply route every 14 (fourteen) days.   testosterone cypionate (DEPOTESTOSTERONE CYPIONATE) 200 MG/ML injection Inject 1 mL (200 mg total) into the muscle every 14 (fourteen) days. INJECT 1ML  INTRAMUSCULAR EVERY 2  WEEKS   Triamcinolone Acetonide (NASACORT AQ NA) Place into the nose. otc   valACYclovir (VALTREX) 1000 MG tablet TAKE 1 TABLET BY MOUTH AS NEEDED       02/19/2023   11:38 AM 07/22/2022   10:37 AM 01/22/2022   11:10 AM 11/16/2021    8:53 AM  GAD 7 : Generalized Anxiety Score  Nervous, Anxious, on Edge 0 0 0 0  Control/stop worrying 0 0 0 0  Worry too much - different things 0 0 0 0  Trouble relaxing 0 0 0 0  Restless 0 0 0 0  Easily annoyed or irritable 0 0 0 0  Afraid - awful might happen 0 0 0 0  Total GAD 7 Score 0 0 0 0  Anxiety Difficulty Not difficult at  all Not difficult at all Not difficult at all        02/19/2023   11:38 AM 07/22/2022   10:37 AM 01/22/2022   11:10 AM  Depression screen PHQ 2/9  Decreased Interest 0 0 0  Down, Depressed, Hopeless 0 0 0  PHQ - 2 Score 0 0 0  Altered sleeping 0 0 0  Tired, decreased energy 0 1 0  Change in appetite 0 0 0  Feeling bad or failure about yourself  0 0 0  Trouble concentrating 0 0 0  Moving slowly or fidgety/restless 0 0 0  Suicidal thoughts 0 0 0  PHQ-9 Score 0 1 0  Difficult doing work/chores Not difficult at all Not difficult at all Not difficult at all    BP Readings from Last 3 Encounters:  02/19/23 118/68  07/22/22 110/80  01/22/22 120/78    Physical Exam Vitals and nursing note reviewed.  HENT:     Head: Normocephalic.     Right Ear: External ear normal.     Left Ear: External ear normal.     Nose: Nose normal.  Eyes:     General: No scleral icterus.       Right eye: No discharge.        Left eye: No discharge.     Conjunctiva/sclera: Conjunctivae normal.     Pupils: Pupils are equal, round, and reactive to light.  Neck:     Thyroid: No thyromegaly.     Vascular: No JVD.     Trachea: No tracheal deviation.  Cardiovascular:     Rate and Rhythm: Normal rate and regular rhythm.     Heart sounds: Normal heart sounds. No murmur heard.    No friction rub. No gallop.  Pulmonary:     Effort: No respiratory distress.     Breath sounds: Normal breath sounds. No wheezing or rales.  Abdominal:     General: Bowel sounds are normal.     Palpations: Abdomen is soft. There is no mass.     Tenderness: There is no abdominal tenderness. There is no guarding or rebound.  Musculoskeletal:        General: No tenderness. Normal range of motion.     Cervical back: Normal range of motion and neck supple.  Lymphadenopathy:     Cervical: No cervical adenopathy.  Skin:    General: Skin is warm.     Findings: No rash.  Neurological:     Mental Status: He is alert.     Wt  Readings from Last 3 Encounters:  02/19/23 192 lb (87.1 kg)  07/22/22 183 lb (83 kg)  01/22/22 168 lb (76.2 kg)    BP 118/68  Pulse 68   Ht 5\' 4"  (1.626 m)   Wt 192 lb (87.1 kg)   SpO2 98%   BMI 32.96 kg/m   Assessment and Plan:  1. Hormone replacement therapy (HRT) Chronic.  Controlled.  Stable.  Continue present dosing of testosterone cypionate 1 and now 200 mg every 14 days.  Will check CMP and lipid panel for current status. - testosterone cypionate (DEPOTESTOSTERONE CYPIONATE) 200 MG/ML injection; Inject 1 mL (200 mg total) into the muscle every 14 (fourteen) days. INJECT 1ML  INTRAMUSCULAR EVERY 2 WEEKS  Dispense: 6 mL; Refill: 1 - Syringe, Disposable, 3 ML MISC; 1 each by Does not apply route every 14 (fourteen) days.  Dispense: 25 each; Refill: 2 - NEEDLE, DISP, 18 G (BD DISP NEEDLES) 18G X 1-1/2" MISC; 1 each by Does not apply route every 14 (fourteen) days.  Dispense: 100 each; Refill: 0 - NEEDLE, DISP, 22 G (BD DISP NEEDLES) 22G X 1-1/2" MISC; 1 each by Does not apply route every 14 (fourteen) days.  Dispense: 100 each; Refill: 0 - Comprehensive metabolic panel - Lipid Panel With LDL/HDL Ratio  2. Taking medication for chronic disease Chronic.  Controlled.  Stable.  Will check CMP for transaminases and electrolytes and lipid panel for current LDL surveillance. - Comprehensive metabolic panel - Lipid Panel With LDL/HDL Ratio    Otilio Miu, MD

## 2023-02-20 LAB — COMPREHENSIVE METABOLIC PANEL
ALT: 28 IU/L (ref 0–44)
AST: 24 IU/L (ref 0–40)
Albumin/Globulin Ratio: 1.7 (ref 1.2–2.2)
Albumin: 4.3 g/dL (ref 4.1–5.1)
Alkaline Phosphatase: 57 IU/L (ref 44–121)
BUN/Creatinine Ratio: 14 (ref 9–20)
BUN: 14 mg/dL (ref 6–24)
Bilirubin Total: 0.4 mg/dL (ref 0.0–1.2)
CO2: 23 mmol/L (ref 20–29)
Calcium: 9 mg/dL (ref 8.7–10.2)
Chloride: 103 mmol/L (ref 96–106)
Creatinine, Ser: 1.03 mg/dL (ref 0.76–1.27)
Globulin, Total: 2.5 g/dL (ref 1.5–4.5)
Glucose: 90 mg/dL (ref 70–99)
Potassium: 4.5 mmol/L (ref 3.5–5.2)
Sodium: 140 mmol/L (ref 134–144)
Total Protein: 6.8 g/dL (ref 6.0–8.5)
eGFR: 92 mL/min/{1.73_m2} (ref >59)

## 2023-02-20 LAB — LIPID PANEL WITH LDL/HDL RATIO
Cholesterol, Total: 198 mg/dL (ref 100–199)
HDL: 40 mg/dL (ref >39)
LDL Chol Calc (NIH): 134 mg/dL — ABNORMAL HIGH (ref 0–99)
LDL/HDL Ratio: 3.4 ratio (ref 0.0–3.6)
Triglycerides: 134 mg/dL (ref 0–149)
VLDL Cholesterol Cal: 24 mg/dL (ref 5–40)

## 2023-02-21 ENCOUNTER — Ambulatory Visit: Payer: 59 | Admitting: Family Medicine

## 2023-02-25 ENCOUNTER — Telehealth: Payer: Self-pay

## 2023-02-25 NOTE — Telephone Encounter (Signed)
Pt going to give me a call back with whether or not he will pay out of pocket

## 2023-02-28 ENCOUNTER — Other Ambulatory Visit: Payer: Self-pay

## 2023-02-28 DIAGNOSIS — E349 Endocrine disorder, unspecified: Secondary | ICD-10-CM

## 2023-02-28 NOTE — Progress Notes (Signed)
Printed labs to [facility] for testosterone

## 2023-03-03 ENCOUNTER — Telehealth: Payer: Self-pay | Admitting: Family Medicine

## 2023-03-03 LAB — TESTOSTERONE,FREE AND TOTAL
Testosterone, Free: 8.5 pg/mL (ref 6.8–21.5)
Testosterone: 122 ng/dL — ABNORMAL LOW (ref 264–916)

## 2023-03-03 NOTE — Telephone Encounter (Signed)
Copied from [place] [id number]. Topic: General - Inquiry >> Mar 03, 2023 12:34 PM Erskine Squibb wrote: Reason for CRM: The patient called in wanting to let his provider know and Baxter Flattery know that he was able to get his blood work done on Friday. Please assist patient further.

## 2023-03-05 ENCOUNTER — Telehealth: Payer: Self-pay | Admitting: Family Medicine

## 2023-03-05 NOTE — Telephone Encounter (Signed)
Copied from [place] [id number]. Topic: General - Other >> Mar 05, 2023  2:20 PM Yolanda T wrote: Reason for CRM: Benjamine Mola from [facility] called to get the prior auth for patients medication faxed to [phone] as they have not yet recvd anything

## 2023-03-10 ENCOUNTER — Telehealth: Payer: Self-pay | Admitting: Family Medicine

## 2023-03-10 NOTE — Telephone Encounter (Signed)
Copied from CRM [id number]. Topic: General - Call Back - No Documentation >> Mar 10, 2023 12:35 PM Ja-Kwan M wrote: Reason for CRM: Pt stated he was returning call to [facility]. Pt requests that the prior authorization for testosterone cypionate (DEPOTESTOSTERONE CYPIONATE) 200 MG/ML injection be faxed to [employer] at fax# [phone]

## 2023-03-10 NOTE — Telephone Encounter (Signed)
Copied from CRM [id number]. Topic: General - Other >> Mar 07, 2023  5:02 PM Ja-Kwan M wrote: Reason for CRM: Pt asked that Delice Bison be made aware that the prior authorization for testosterone cypionate (DEPOTESTOSTERONE CYPIONATE) 200 MG/ML injection should be faxed to [facility] [employer] at fax# [phone]

## 2023-03-20 ENCOUNTER — Other Ambulatory Visit: Payer: Self-pay

## 2023-03-20 DIAGNOSIS — E7801 Familial hypercholesterolemia: Secondary | ICD-10-CM

## 2023-03-20 MED ORDER — EZETIMIBE 10 MG PO TABS: ORAL_TABLET | ORAL | 0 refills | Status: DC

## 2023-07-28 ENCOUNTER — Other Ambulatory Visit: Payer: Self-pay

## 2023-07-28 DIAGNOSIS — E7801 Familial hypercholesterolemia: Secondary | ICD-10-CM

## 2023-07-28 MED ORDER — EZETIMIBE 10 MG PO TABS: ORAL_TABLET | ORAL | 0 refills | Status: DC

## 2023-08-22 ENCOUNTER — Ambulatory Visit: Payer: BC Managed Care – PPO | Admitting: Family Medicine

## 2023-10-15 ENCOUNTER — Ambulatory Visit: Payer: Self-pay

## 2023-10-15 NOTE — Telephone Encounter (Signed)
    Chief Complaint: Larey Seat 2 weeks ago going up stairs. Fell on right knee. Knee is very painful, hurts to walk. On his way to orthopedic appointment at [facility], phone number [phone]. Asking if PCP will call them and give a referral. Declines OV. Symptoms: Above Frequency: 2 weeks ago Pertinent Negatives: Patient denies  Disposition: [] ED /[] Urgent Care (no appt availability in office) / [] Appointment(In office/virtual)/ []  [facility]/ [] Home Care/ [x] Refused Recommended Disposition /[] [place] [facility]/ [x]  Follow-up with PCP Additional Notes: Please advise pt.  Reason for Disposition  [1] Caller has URGENT question AND [2] triager unable to answer question  Answer Assessment - Initial Assessment Questions 1. MECHANISM: "How did the fall happen?"     Tripped going up stairs 2. DOMESTIC VIOLENCE AND ELDER ABUSE SCREENING: "Did you fall because someone pushed you or tried to hurt you?" If Yes, ask: "Are you safe now?"     No 3. ONSET: "When did the fall happen?" (e.g., minutes, hours, or days ago)     2 weeks ago 4. LOCATION: "What part of the body hit the ground?" (e.g., back, buttocks, head, hips, knees, hands, head, stomach)     Right knee 5. INJURY: "Did you hurt (injure) yourself when you fell?" If Yes, ask: "What did you injure? Tell me more about this?" (e.g., body area; type of injury; pain severity)"     Right knee 6. PAIN: "Is there any pain?" If Yes, ask: "How bad is the pain?" (e.g., Scale 1-10; or mild,  moderate, severe)   - NONE (0): No pain   - MILD (1-3): Doesn't interfere with normal activities    - MODERATE (4-7): Interferes with normal activities or awakens from sleep    - SEVERE (8-10): Excruciating pain, unable to do any normal activities      Sharp - severe 7. SIZE: For cuts, bruises, or swelling, ask: "How large is it?" (e.g., inches or centimeters)      No 8. PREGNANCY: "Is there any chance you are pregnant?" "When was  your last menstrual period?"     N/a 9. OTHER SYMPTOMS: "Do you have any other symptoms?" (e.g., dizziness, fever, weakness; new onset or worsening).      No 10. CAUSE: "What do you think caused the fall (or falling)?" (e.g., tripped, dizzy spell)       Tripped  Protocols used: Falls and [facility]

## 2023-10-15 NOTE — Telephone Encounter (Signed)
Please schedule pt an appt.  KP

## 2023-10-21 ENCOUNTER — Ambulatory Visit: Payer: BC Managed Care – PPO | Admitting: Family Medicine

## 2023-10-21 ENCOUNTER — Encounter: Payer: Self-pay | Admitting: Family Medicine

## 2023-10-21 VITALS — BP 102/68 | HR 68 | Resp 16 | Ht 64.0 in | Wt 185.0 lb

## 2023-10-21 DIAGNOSIS — Z23 Encounter for immunization: Secondary | ICD-10-CM | POA: Diagnosis not present

## 2023-10-21 DIAGNOSIS — K219 Gastro-esophageal reflux disease without esophagitis: Secondary | ICD-10-CM

## 2023-10-21 DIAGNOSIS — E7801 Familial hypercholesterolemia: Secondary | ICD-10-CM

## 2023-10-21 DIAGNOSIS — Z79890 Hormone replacement therapy (postmenopausal): Secondary | ICD-10-CM

## 2023-10-21 DIAGNOSIS — J301 Allergic rhinitis due to pollen: Secondary | ICD-10-CM | POA: Diagnosis not present

## 2023-10-21 DIAGNOSIS — K121 Other forms of stomatitis: Secondary | ICD-10-CM

## 2023-10-21 DIAGNOSIS — B349 Viral infection, unspecified: Secondary | ICD-10-CM

## 2023-10-21 MED ORDER — "BD DISP NEEDLES 22G X 1-1/2"" MISC": 1.0000 | 0 refills | Status: DC

## 2023-10-21 MED ORDER — EZETIMIBE 10 MG PO TABS: ORAL_TABLET | ORAL | 0 refills | Status: DC

## 2023-10-21 MED ORDER — TESTOSTERONE CYPIONATE 200 MG/ML IM SOLN: 200.0000 mg | INTRAMUSCULAR | 1 refills | Status: DC

## 2023-10-21 MED ORDER — "BD DISP NEEDLES 18G X 1-1/2"" MISC": 1.0000 | 0 refills | Status: DC

## 2023-10-21 MED ORDER — VALACYCLOVIR HCL 1 G PO TABS: ORAL_TABLET | ORAL | 1 refills | Status: DC

## 2023-10-21 MED ORDER — SYRINGE (DISPOSABLE) 3 ML MISC: 1.0000 | 2 refills | Status: DC

## 2023-10-21 MED ORDER — CETIRIZINE HCL 10 MG PO TABS: ORAL_TABLET | ORAL | 0 refills | Status: DC

## 2023-10-21 MED ORDER — PANTOPRAZOLE SODIUM 40 MG PO TBEC: DELAYED_RELEASE_TABLET | ORAL | 1 refills | Status: DC

## 2023-10-21 MED ORDER — EZETIMIBE 10 MG PO TABS: ORAL_TABLET | ORAL | 1 refills | Status: DC

## 2023-10-21 MED ORDER — PANTOPRAZOLE SODIUM 40 MG PO TBEC: DELAYED_RELEASE_TABLET | ORAL | 0 refills | Status: DC

## 2023-10-21 NOTE — Progress Notes (Signed)
Date:  10/21/2023   Name:  Rick Mason   DOB:  June 28, 1976   MRN:  [id number]   Chief Complaint: HRT  Patient is a 47 year old male who presents for a none exam. The patient reports the following problems: none. Health maintenance has been reviewed up to date.    Hyperlipidemia This is a chronic problem. The current episode started more than 1 year ago. The problem is controlled. Recent lipid tests were reviewed and are normal. He has no history of chronic renal disease. Pertinent negatives include no chest pain, focal sensory loss, myalgias or shortness of breath. Current antihyperlipidemic treatment includes exercise and ezetimibe.  Gastroesophageal Reflux He reports no abdominal pain, no chest pain, no coughing, no dysphagia, no heartburn, no hoarse voice, no nausea, no sore throat, no tooth decay, no water brash or no wheezing. This is a chronic problem. The current episode started more than 1 year ago. The problem has been gradually improving. The symptoms are aggravated by certain foods. He has tried a PPI for the symptoms.    Lab Results  Component Value Date   NA 140 02/19/2023   K 4.5 02/19/2023   CO2 23 02/19/2023   GLUCOSE 90 02/19/2023   BUN 14 02/19/2023   CREATININE 1.03 02/19/2023   CALCIUM 9.0 02/19/2023   EGFR 92 02/19/2023   GFRNONAA 106 08/28/2020   Lab Results  Component Value Date   CHOL 198 02/19/2023   HDL 40 02/19/2023   LDLCALC 134 (H) 02/19/2023   LDLDIRECT 155 (H) 07/02/2021   TRIG 134 02/19/2023   CHOLHDL 4.8 12/08/2018   No results found for: "TSH" No results found for: "HGBA1C" Lab Results  Component Value Date   HGB 16.9 06/14/2019   Lab Results  Component Value Date   ALT 28 02/19/2023   AST 24 02/19/2023   ALKPHOS 57 02/19/2023   BILITOT 0.4 02/19/2023   No results found for: "25OHVITD2", "25OHVITD3", "VD25OH"   Review of Systems  Constitutional:  Negative for chills and fever.  HENT:  Negative for drooling, ear discharge, ear  pain, hoarse voice, nosebleeds, sinus pain and sore throat.   Respiratory:  Negative for cough, shortness of breath and wheezing.   Cardiovascular:  Negative for chest pain, palpitations and leg swelling.  Gastrointestinal:  Negative for abdominal pain, blood in stool, constipation, diarrhea, dysphagia, heartburn and nausea.  Endocrine: Negative for polydipsia.  Genitourinary:  Negative for dysuria, frequency, hematuria and urgency.  Musculoskeletal:  Negative for back pain, myalgias and neck pain.  Skin:  Negative for rash.  Allergic/Immunologic: Negative for environmental allergies.  Neurological:  Negative for dizziness and headaches.  Hematological:  Does not bruise/bleed easily.  Psychiatric/Behavioral:  Negative for suicidal ideas. The patient is not nervous/anxious.     Patient Active Problem List   Diagnosis Date Noted   Stomatitis 04/20/2018   Cervical radiculopathy 04/20/2018   Hormone replacement therapy (HRT) 04/20/2018   Viral syndrome 04/20/2018   Hypogonadism male 05/01/2017   Mixed hyperlipidemia 05/01/2017   Taking medication for chronic disease 05/01/2017    Allergies  Allergen Reactions   Penicillins     Past Surgical History:  Procedure Laterality Date   MASTECTOMY Bilateral     Social History   Tobacco Use   Smoking status: Former    Current packs/day: 0.00    Types: Cigarettes    Quit date: 01/21/2015    Years since quitting: 8.7   Smokeless tobacco: Never  Substance Use Topics  Alcohol use: No    Alcohol/week: 0.0 standard drinks of alcohol   Drug use: No     Medication list has been reviewed and updated.  Current Meds  Medication Sig   cetirizine (ZYRTEC) 10 MG tablet TAKE 1 TABLET(10 MG) BY MOUTH DAILY   ezetimibe (ZETIA) 10 MG tablet Take 1 tab daily   ketoconazole (NIZORAL) 2 % cream Apply 1 application topically daily.   NEEDLE, DISP, 18 G (BD DISP NEEDLES) 18G X 1-1/2" MISC 1 each by Does not apply route every 14 (fourteen) days.    NEEDLE, DISP, 22 G (BD DISP NEEDLES) 22G X 1-1/2" MISC 1 each by Does not apply route every 14 (fourteen) days.   pantoprazole (PROTONIX) 40 MG tablet Take 1 tablet by mouth daily   Syringe, Disposable, 3 ML MISC 1 each by Does not apply route every 14 (fourteen) days.   testosterone cypionate (DEPOTESTOSTERONE CYPIONATE) 200 MG/ML injection Inject 1 mL (200 mg total) into the muscle every 14 (fourteen) days. INJECT  INTRAMUSCULAR EVERY 2 WEEKS   Triamcinolone Acetonide (NASACORT AQ NA) Place into the nose. otc   valACYclovir (VALTREX) 1000 MG tablet TAKE 1 TABLET BY MOUTH AS NEEDED       10/21/2023    2:32 PM 02/19/2023   11:38 AM 07/22/2022   10:37 AM 01/22/2022   11:10 AM  GAD 7 : Generalized Anxiety Score  Nervous, Anxious, on Edge 0 0 0 0  Control/stop worrying 0 0 0 0  Worry too much - different things 0 0 0 0  Trouble relaxing 0 0 0 0  Restless 0 0 0 0  Easily annoyed or irritable 0 0 0 0  Afraid - awful might happen 0 0 0 0  Total GAD 7 Score 0 0 0 0  Anxiety Difficulty  Not difficult at all Not difficult at all Not difficult at all       10/21/2023    2:25 PM 02/19/2023   11:38 AM 07/22/2022   10:37 AM  Depression screen PHQ 2/9  Decreased Interest 0 0 0  Down, Depressed, Hopeless 0 0 0  PHQ - 2 Score 0 0 0  Altered sleeping  0 0  Tired, decreased energy  0 1  Change in appetite  0 0  Feeling bad or failure about yourself   0 0  Trouble concentrating  0 0  Moving slowly or fidgety/restless  0 0  Suicidal thoughts  0 0  PHQ-9 Score  0 1  Difficult doing work/chores  Not difficult at all Not difficult at all    BP Readings from Last 3 Encounters:  10/21/23 102/68  02/19/23 118/68  07/22/22 110/80    Physical Exam Vitals and nursing note reviewed.  HENT:     Head: Normocephalic.     Right Ear: Tympanic membrane and external ear normal.     Left Ear: Tympanic membrane and external ear normal.     Nose: Nose normal.  Eyes:     General: No scleral  icterus.       Right eye: No discharge.        Left eye: No discharge.     Conjunctiva/sclera: Conjunctivae normal.     Pupils: Pupils are equal, round, and reactive to light.  Neck:     Thyroid: No thyromegaly.     Vascular: No JVD.     Trachea: No tracheal deviation.  Cardiovascular:     Rate and Rhythm: Normal rate and regular rhythm.  Heart sounds: Normal heart sounds. No murmur heard.    No friction rub. No gallop.  Pulmonary:     Effort: No respiratory distress.     Breath sounds: Normal breath sounds. No wheezing, rhonchi or rales.  Chest:     Chest wall: No tenderness.  Abdominal:     General: Bowel sounds are normal.     Palpations: Abdomen is soft. There is no mass.     Tenderness: There is no abdominal tenderness. There is no guarding or rebound.  Musculoskeletal:        General: No tenderness. Normal range of motion.     Cervical back: Normal range of motion and neck supple.  Lymphadenopathy:     Cervical: No cervical adenopathy.  Skin:    General: Skin is warm.     Findings: No rash.  Neurological:     Mental Status: He is alert and oriented to person, place, and time.     Cranial Nerves: No cranial nerve deficit.     Deep Tendon Reflexes: Reflexes are normal and symmetric.     Wt Readings from Last 3 Encounters:  10/21/23 185 lb (83.9 kg)  02/19/23 192 lb (87.1 kg)  07/22/22 183 lb (83 kg)    BP 102/68   Pulse 68   Resp 16   Ht 5\' 4"  (1.626 m)   Wt 185 lb (83.9 kg)   SpO2 95%   BMI 31.76 kg/m   Assessment and Plan: 1. Seasonal allergic rhinitis due to pollen Chronic.  Controlled.  Stable.  Continue Zyrtec and Flonase at current dosing. - cetirizine (ZYRTEC) 10 MG tablet; TAKE 1 TABLET(10 MG) BY MOUTH DAILY  Dispense: 90 tablet; Refill: 0  2. Familial hypercholesterolemia .  Controlled.  Stable.  Asymptomatic.  Tolerating Zetia 10 mg once a day.  Will recheck in 6 months. - ezetimibe (ZETIA) 10 MG tablet; Take 1 tab daily  Dispense: 90  tablet; Refill: 1. Hormone replacement therapy (HRT) Chronic.  Controlled.  Stable.  Hormone replacement therapy will continue at current dosing of testosterone replacement. - NEEDLE, DISP, 18 G (BD DISP NEEDLES) 18G X 1-1/2" MISC; 1 each by Does not apply route every 14 (fourteen) days.  Dispense: 100 each; Refill: 0 - NEEDLE, DISP, 22 G (BD DISP NEEDLES) 22G X 1-1/2" MISC; 1 each by Does not apply route every 14 (fourteen) days.  Dispense: 100 each; Refill: 0 - Syringe, Disposable, 3 ML MISC; 1 each by Does not apply route every 14 (fourteen) days.  Dispense: 25 each; Refill: 2 - testosterone cypionate (DEPOTESTOSTERONE CYPIONATE) 200 MG/ML injection; Inject 1 mL (200 mg total) into the muscle every 14 (fourteen) days. INJECT  INTRAMUSCULAR EVERY 2 WEEKS  Dispense: 6 mL; Refill: 1  4. Gastroesophageal reflux disease, unspecified whether esophagitis present .  Controlled.  Stable.  Continue pantoprazole 40 mg once a day. - pantoprazole (PROTONIX) 40 MG tablet; Take 1 tablet by mouth daily  Dispense: 90 tablet; Refill: 0  5. Viral syndrome Chronic.  Controlled.  Stable continue valacyclovir at current dosing. - valACYclovir (VALTREX) 1000 MG tablet; TAKE 1 TABLET BY MOUTH AS NEEDED  Dispense: 30 tablet; Refill: 1  6. Stomatitis As noted above - valACYclovir (VALTREX) 1000 MG tablet; TAKE 1 TABLET BY MOUTH AS NEEDED  Dispense: 30 tablet; Refill: 1     Elizabeth Sauer, MD

## 2023-10-21 NOTE — Addendum Note (Signed)
Addended by: Clayborne Artist A on: 10/21/2023 03:11 PM   Modules accepted: Orders

## 2023-10-22 ENCOUNTER — Encounter: Payer: Self-pay | Admitting: Family Medicine

## 2023-10-22 LAB — LIPID PANEL
Chol/HDL Ratio: 4.9 ratio (ref 0.0–5.0)
Cholesterol, Total: 209 mg/dL — ABNORMAL HIGH (ref 100–199)
HDL: 43 mg/dL (ref >39)
LDL Chol Calc (NIH): 143 mg/dL — ABNORMAL HIGH (ref 0–99)
Triglycerides: 127 mg/dL (ref 0–149)
VLDL Cholesterol Cal: 23 mg/dL (ref 5–40)

## 2024-03-04 ENCOUNTER — Other Ambulatory Visit: Payer: Self-pay | Admitting: Family Medicine

## 2024-03-04 DIAGNOSIS — K219 Gastro-esophageal reflux disease without esophagitis: Secondary | ICD-10-CM

## 2024-03-04 DIAGNOSIS — J301 Allergic rhinitis due to pollen: Secondary | ICD-10-CM

## 2024-03-04 DIAGNOSIS — E7801 Familial hypercholesterolemia: Secondary | ICD-10-CM

## 2024-03-04 NOTE — Telephone Encounter (Signed)
 Copied from CRM [id number]. Topic: Clinical - Medication Refill >> Mar 04, 2024  1:57 PM Truddie Crumble wrote: Most Recent Primary Care Visit:  Provider: Duanne Limerick  Department: [facility]  Visit Type: OFFICE VISIT  Date: 10/21/2023  Medication: cetirizine (ZYRTEC) 10 MG tablet, ezetimibe (ZETIA) 10 MG tablet and pantoprazole (PROTONIX) 40 MG tablet  Has the patient contacted their pharmacy? No (Agent: If no, request that the patient contact the pharmacy for the refill. If patient does not wish to contact the pharmacy document the reason why and proceed with request.) (Agent: If yes, when and what did the pharmacy advise?)  Is this the correct pharmacy for this prescription? Yes If no, delete pharmacy and type the correct one.  This is the patient's preferred pharmacy:   [facility]  [id number] [place]  Has the prescription been filled recently? No  Is the patient out of the medication? Yes  Has the patient been seen for an appointment in the last year OR does the patient have an upcoming appointment? Yes  Can we respond through MyChart? Yes  Agent: Please be advised that Rx refills may take up to 3 business days. We ask that you follow-up with your pharmacy.

## 2024-03-05 ENCOUNTER — Other Ambulatory Visit: Payer: Self-pay

## 2024-03-05 MED ORDER — CETIRIZINE HCL 10 MG PO TABS: ORAL_TABLET | ORAL | 0 refills | Status: DC

## 2024-03-05 MED ORDER — PANTOPRAZOLE SODIUM 40 MG PO TBEC
DELAYED_RELEASE_TABLET | ORAL | 1 refills | Status: AC
Start: 2024-03-05 — End: ?

## 2024-03-05 NOTE — Telephone Encounter (Signed)
 Requested medication (s) are due for refill today: cetitizine: yes      ezetimbe: no  Requested medication (s) are on the active medication list: yes  Last refill:  both last reordered 10/21/23   Future visit scheduled: yes  Notes to clinic:  overdue labs   Requested Prescriptions  Pending Prescriptions Disp Refills   cetirizine (ZYRTEC) 10 MG tablet 90 tablet     Sig: TAKE 1 TABLET(10 MG) BY MOUTH DAILY     Ear, Nose, and Throat:  Antihistamines 2 Failed - 03/05/2024 12:40 PM      Failed - Cr in normal range and within 360 days    Creatinine, Ser  Date Value Ref Range Status  02/19/2023 1.03 0.76 - 1.27 mg/dL Final         Failed - Valid encounter within last 12 months    Recent Outpatient Visits   None             ezetimibe (ZETIA) 10 MG tablet 90 tablet     Sig: Take 1 tab daily     Cardiovascular:  Antilipid - Sterol Transport Inhibitors Failed - 03/05/2024 12:40 PM      Failed - AST in normal range and within 360 days    AST  Date Value Ref Range Status  02/19/2023 24 0 - 40 IU/L Final         Failed - ALT in normal range and within 360 days    ALT  Date Value Ref Range Status  02/19/2023 28 0 - 44 IU/L Final         Failed - Valid encounter within last 12 months    Recent Outpatient Visits   None            Failed - Lipid Panel in normal range within the last 12 months    Cholesterol, Total  Date Value Ref Range Status  10/21/2023 209 (H) 100 - 199 mg/dL Final   LDL Chol Calc (NIH)  Date Value Ref Range Status  10/21/2023 143 (H) 0 - 99 mg/dL Final   LDL Direct  Date Value Ref Range Status  07/02/2021 155 (H) 0 - 99 mg/dL Final   HDL  Date Value Ref Range Status  10/21/2023 43 >39 mg/dL Final   Triglycerides  Date Value Ref Range Status  10/21/2023 127 0 - 149 mg/dL Final         Passed - Patient is not pregnant      Signed Prescriptions Disp Refills   pantoprazole (PROTONIX) 40 MG tablet 90 tablet 1    Sig: Take 1 tablet by  mouth daily     Gastroenterology: Proton Pump Inhibitors Failed - 03/05/2024 12:40 PM      Failed - Valid encounter within last 12 months    Recent Outpatient Visits   None

## 2024-03-05 NOTE — Telephone Encounter (Signed)
 Requested Prescriptions  Pending Prescriptions Disp Refills   cetirizine (ZYRTEC) 10 MG tablet 90 tablet     Sig: TAKE 1 TABLET(10 MG) BY MOUTH DAILY     Ear, Nose, and Throat:  Antihistamines 2 Failed - 03/05/2024 12:39 PM      Failed - Cr in normal range and within 360 days    Creatinine, Ser  Date Value Ref Range Status  02/19/2023 1.03 0.76 - 1.27 mg/dL Final         Failed - Valid encounter within last 12 months    Recent Outpatient Visits   None             ezetimibe (ZETIA) 10 MG tablet 90 tablet     Sig: Take 1 tab daily     Cardiovascular:  Antilipid - Sterol Transport Inhibitors Failed - 03/05/2024 12:39 PM      Failed - AST in normal range and within 360 days    AST  Date Value Ref Range Status  02/19/2023 24 0 - 40 IU/L Final         Failed - ALT in normal range and within 360 days    ALT  Date Value Ref Range Status  02/19/2023 28 0 - 44 IU/L Final         Failed - Valid encounter within last 12 months    Recent Outpatient Visits   None            Failed - Lipid Panel in normal range within the last 12 months    Cholesterol, Total  Date Value Ref Range Status  10/21/2023 209 (H) 100 - 199 mg/dL Final   LDL Chol Calc (NIH)  Date Value Ref Range Status  10/21/2023 143 (H) 0 - 99 mg/dL Final   LDL Direct  Date Value Ref Range Status  07/02/2021 155 (H) 0 - 99 mg/dL Final   HDL  Date Value Ref Range Status  10/21/2023 43 >39 mg/dL Final   Triglycerides  Date Value Ref Range Status  10/21/2023 127 0 - 149 mg/dL Final         Passed - Patient is not pregnant       pantoprazole (PROTONIX) 40 MG tablet 90 tablet 1    Sig: Take 1 tablet by mouth daily     Gastroenterology: Proton Pump Inhibitors Failed - 03/05/2024 12:39 PM      Failed - Valid encounter within last 12 months    Recent Outpatient Visits   None

## 2024-05-17 ENCOUNTER — Encounter: Payer: Self-pay | Admitting: Physician Assistant

## 2024-05-17 ENCOUNTER — Ambulatory Visit: Payer: Self-pay | Admitting: Physician Assistant

## 2024-05-17 ENCOUNTER — Other Ambulatory Visit (HOSPITAL_COMMUNITY)
Admission: RE | Admit: 2024-05-17 | Discharge: 2024-05-17 | Disposition: A | Discharge status: Home / Self Care | Source: Ambulatory Visit | Attending: Physician Assistant | Admitting: Physician Assistant

## 2024-05-17 VITALS — BP 100/74 | HR 81 | Temp 98.0°F | Ht 64.0 in | Wt 177.0 lb

## 2024-05-17 DIAGNOSIS — G8929 Other chronic pain: Secondary | ICD-10-CM

## 2024-05-17 DIAGNOSIS — J301 Allergic rhinitis due to pollen: Secondary | ICD-10-CM

## 2024-05-17 DIAGNOSIS — M25511 Pain in right shoulder: Secondary | ICD-10-CM

## 2024-05-17 DIAGNOSIS — Z0000 Encounter for general adult medical examination without abnormal findings: Secondary | ICD-10-CM | POA: Diagnosis not present

## 2024-05-17 DIAGNOSIS — Z79890 Hormone replacement therapy (postmenopausal): Secondary | ICD-10-CM | POA: Diagnosis not present

## 2024-05-17 DIAGNOSIS — K219 Gastro-esophageal reflux disease without esophagitis: Secondary | ICD-10-CM

## 2024-05-17 DIAGNOSIS — Z1211 Encounter for screening for malignant neoplasm of colon: Secondary | ICD-10-CM

## 2024-05-17 DIAGNOSIS — B009 Herpesviral infection, unspecified: Secondary | ICD-10-CM | POA: Insufficient documentation

## 2024-05-17 DIAGNOSIS — E782 Mixed hyperlipidemia: Secondary | ICD-10-CM

## 2024-05-17 DIAGNOSIS — F640 Transsexualism: Secondary | ICD-10-CM

## 2024-05-17 DIAGNOSIS — B360 Pityriasis versicolor: Secondary | ICD-10-CM

## 2024-05-17 DIAGNOSIS — Z124 Encounter for screening for malignant neoplasm of cervix: Secondary | ICD-10-CM | POA: Insufficient documentation

## 2024-05-17 DIAGNOSIS — Z79899 Other long term (current) drug therapy: Secondary | ICD-10-CM

## 2024-05-17 MED ORDER — KETOCONAZOLE 2 % EX CREA: 1.0000 | TOPICAL_CREAM | Freq: Every day | CUTANEOUS | 0 refills | Status: AC

## 2024-05-17 MED ORDER — PANTOPRAZOLE SODIUM 40 MG PO TBEC: DELAYED_RELEASE_TABLET | ORAL | 1 refills | Status: AC

## 2024-05-17 MED ORDER — DICLOFENAC SODIUM 1 % EX GEL: 2.0000 g | Freq: Four times a day (QID) | CUTANEOUS | 1 refills | Status: AC

## 2024-05-17 MED ORDER — VALACYCLOVIR HCL 1 G PO TABS: ORAL_TABLET | ORAL | 1 refills | Status: AC

## 2024-05-17 MED ORDER — SYRINGE (DISPOSABLE) 3 ML MISC
1.0000 | 2 refills | Status: AC
Start: 2024-05-17 — End: ?

## 2024-05-17 MED ORDER — CETIRIZINE HCL 10 MG PO TABS: ORAL_TABLET | ORAL | 0 refills | Status: DC

## 2024-05-17 MED ORDER — EZETIMIBE 10 MG PO TABS: ORAL_TABLET | ORAL | 1 refills | Status: DC

## 2024-05-17 MED ORDER — TESTOSTERONE CYPIONATE 200 MG/ML IM SOLN: 200.0000 mg | INTRAMUSCULAR | 1 refills | Status: AC

## 2024-05-17 MED ORDER — BD DISP NEEDLES 22G X 1-1/2" MISC: 1.0000 | 0 refills | Status: AC

## 2024-05-17 MED ORDER — BD DISP NEEDLES 18G X 1-1/2" MISC: 1.0000 | 0 refills | Status: AC

## 2024-05-17 NOTE — Progress Notes (Signed)
 Date:  05/17/2024   Name:  Rick Mason   DOB:  June 26, 1976   MRN:  [id number]   Chief Complaint: Annual Exam (Wants blood work, questions about diabetes and heart due to family history ) and Shoulder Pain (Wants a referral to ortho is ok to see dr Rick Mason)  HPI Rick Mason is a pleasant 48 year old trans male with a history of HTN, HLD, GERD, and ongoing testosterone  therapy who presents new to me today as a transfer of care from my colleague Dr. Alayne Mason who has recently retired. He reports last dose of testosterone  was about 18 days ago. Requesting refills on all medications. Currently living in [place] away.   He is overdue for routine physical and long overdue for pap (all male pelvic anatomy intact). Last pap appears to have been 2017, which was NILM.   Complains also of chronic right shoulder pain. Was getting better with physical therapy for a time, but then stopped improving.  Would be interested in seeing my sports med colleague Dr. Rebekah Mason for co-management of this problem. Wonders if perhaps he might have rheumatoid arthritis, complaining of hand arthralgias.   Last Dental Exam: <85m ago Last Eye Exam: 2-3y ago Last CRC screen: never Last Pap: 2017 NILM Immunizations Due: None    Medication list has been reviewed and updated.  Current Meds  Medication Sig   diclofenac Sodium (VOLTAREN) 1 % GEL Apply 2 g topically 4 (four) times daily. Use on affected joint up to 4x/day as needed. 2 grams is roughly 4 fingertips' worth of gel.     Review of Systems  Patient Active Problem List   Diagnosis Date Noted   HSV infection 05/17/2024   Stomatitis 04/20/2018   Cervical radiculopathy 04/20/2018   Hormone replacement therapy (HRT) 04/20/2018   Hypogonadism male 05/01/2017   Mixed hyperlipidemia 05/01/2017    Allergies  Allergen Reactions   Penicillins     Immunization History  Administered Date(s) Administered   Influenza, Seasonal,  Injecte, Preservative Fre 10/21/2023   Influenza,inj,Quad PF,6+ Mos 07/31/2017, 09/02/2019, 08/28/2020, 02/19/2023   Influenza-Unspecified 11/26/2021   Moderna Sars-Covid-2 Vaccination 02/10/2020, 03/09/2020, 11/26/2021   Pfizer(Comirnaty)Fall Seasonal Vaccine 12 years and older 10/21/2023   Tdap 12/08/2018    Past Surgical History:  Procedure Laterality Date   MASTECTOMY Bilateral     Social History   Tobacco Use   Smoking status: Former    Current packs/day: 0.00    Average packs/day: 0.5 packs/day for 4.0 years (2.0 ttl pk-yrs)    Types: Cigarettes    Start date: 72    Quit date: 2000    Years since quitting: 25.4   Smokeless tobacco: Never  Vaping Use   Vaping status: Every Day   Substances: CBD  Substance Use Topics   Alcohol use: No    Alcohol/week: 0.0 standard drinks of alcohol   Drug use: Yes    Types: Marijuana    Family History  Problem Relation Age of Onset   Cancer Mother    Diabetes Father    Heart disease Father    Pulmonary fibrosis Father    Cancer Maternal Grandfather    Heart disease Maternal Grandfather    Diabetes Paternal Grandmother    Cancer Paternal Grandmother    Cancer Paternal Grandfather         05/17/2024    2:16 PM 10/21/2023    2:32 PM 02/19/2023   11:38 AM 07/22/2022   10:37 AM  GAD 7 :  Generalized Anxiety Score  Nervous, Anxious, on Edge 1 0 0 0  Control/stop worrying 1 0 0 0  Worry too much - different things 1 0 0 0  Trouble relaxing 0 0 0 0  Restless 0 0 0 0  Easily annoyed or irritable 0 0 0 0  Afraid - awful might happen 0 0 0 0  Total GAD 7 Score 3 0 0 0  Anxiety Difficulty Not difficult at all  Not difficult at all Not difficult at all       05/17/2024    2:15 PM 10/21/2023    2:25 PM 02/19/2023   11:38 AM  Depression screen PHQ 2/9  Decreased Interest 0 0 0  Down, Depressed, Hopeless 0 0 0  PHQ - 2 Score 0 0 0  Altered sleeping   0  Tired, decreased energy   0  Change in appetite   0  Feeling bad or  failure about yourself    0  Trouble concentrating   0  Moving slowly or fidgety/restless   0  Suicidal thoughts   0  PHQ-9 Score   0  Difficult doing work/chores   Not difficult at all    BP Readings from Last 3 Encounters:  05/17/24 100/74  10/21/23 102/68  02/19/23 118/68    Wt Readings from Last 3 Encounters:  05/17/24 177 lb (80.3 kg)  10/21/23 185 lb (83.9 kg)  02/19/23 192 lb (87.1 kg)    BP 100/74   Pulse 81   Temp 98 F (36.7 C)   Ht 5' 4 (1.626 m)   Wt 177 lb (80.3 kg)   SpO2 96%   BMI 30.38 kg/m   Physical Exam Vitals and nursing note reviewed. Exam conducted with a chaperone present.  Constitutional:      Appearance: Normal appearance. He is well-groomed.  HENT:     Ears:     Comments: EAC clear bilaterally with good view of TM which is without effusion or erythema.     Nose: Nose normal.     Mouth/Throat:     Mouth: Mucous membranes are moist. No oral lesions.     Dentition: Normal dentition.     Pharynx: Uvula midline. No posterior oropharyngeal erythema.   Eyes:     General: Vision grossly intact.     Extraocular Movements: Extraocular movements intact.     Conjunctiva/sclera: Conjunctivae normal.     Pupils: Pupils are equal, round, and reactive to light.   Neck:     Thyroid: No thyroid mass or thyromegaly.   Cardiovascular:     Rate and Rhythm: Normal rate and regular rhythm.     Heart sounds: S1 normal and S2 normal. No murmur heard.    No friction rub. No gallop.     Comments: Pulses 2+ at radial, PT, DP bilaterally. No carotid bruit. No peripheral edema Pulmonary:     Effort: Pulmonary effort is normal.     Breath sounds: Normal breath sounds.  Chest:     Comments: Breast exam completed evaluating patient's limited remaining breast tissue s/p bilateral mastectomy. Exam without suspicious masses, asymmetry, or lymphadenopathy Abdominal:     General: Bowel sounds are normal.     Palpations: Abdomen is soft. There is no mass.      Tenderness: There is no abdominal tenderness.  Genitourinary:    Comments: Typical external male genitalia without rashes, lesions, or tenderness. Speculum exam reveals a centrally located, normal-appearing cervix with scant thin white discharge. Pap sample  obtained using endocervical broom, well-tolerated. Bimanual exam without masses or CMT.   Musculoskeletal:     Comments: Full ROM with strength 5/5 bilateral upper and lower extremities  Lymphadenopathy:     Cervical: No cervical adenopathy.   Skin:    General: Skin is warm.     Capillary Refill: Capillary refill takes less than 2 seconds.     Findings: No lesion or rash.   Neurological:     Mental Status: He is alert and oriented to person, place, and time.     Cranial Nerves: Cranial nerves 2-12 are intact.     Gait: Gait is intact.   Psychiatric:        Mood and Affect: Mood and affect normal.        Behavior: Behavior normal.     Recent Labs     Component Value Date/Time   NA 140 02/19/2023 1200   K 4.5 02/19/2023 1200   CL 103 02/19/2023 1200   CO2 23 02/19/2023 1200   GLUCOSE 90 02/19/2023 1200   BUN 14 02/19/2023 1200   CREATININE 1.03 02/19/2023 1200   CALCIUM 9.0 02/19/2023 1200   PROT 6.8 02/19/2023 1200   ALBUMIN 4.3 02/19/2023 1200   AST 24 02/19/2023 1200   ALT 28 02/19/2023 1200   ALKPHOS 57 02/19/2023 1200   BILITOT 0.4 02/19/2023 1200   GFRNONAA 106 08/28/2020 1458   GFRAA 122 08/28/2020 1458    Lab Results  Component Value Date   HGB 16.9 06/14/2019   No results found for: HGBA1C Lab Results  Component Value Date   CHOL 209 (H) 10/21/2023   HDL 43 10/21/2023   LDLCALC 143 (H) 10/21/2023   LDLDIRECT 155 (H) 07/02/2021   TRIG 127 10/21/2023   CHOLHDL 4.9 10/21/2023   No results found for: TSH   Assessment and Plan:  1. Annual physical exam (Primary) Seemingly healthy patient with no abnormalities on exam. Encouraged healthy lifestyle including regular physical activity and  consumption of whole fruits and vegetables. Encouraged routine dental and eye exams. Vaccinations up to date.   - CBC with Differential/Platelet - Comprehensive metabolic panel with GFR - TSH - Lipid panel - HIV Antibody (routine testing w rflx) - Hepatitis C antibody  2. Encounter for Papanicolaou smear of cervix - Cytology - PAP  3. Chronic right shoulder pain Try topical diclofenac. Also ordering shoulder xray and proceeding with internal referral to sports med.   - diclofenac Sodium (VOLTAREN) 1 % GEL; Apply 2 g topically 4 (four) times daily. Use on affected joint up to 4x/day as needed. 2 grams is roughly 4 fingertips' worth of gel.  Dispense: 100 g; Refill: 1 - DG Shoulder Right; Future  4. Hormone replacement therapy (HRT) Refill supplies and testosterone  for ongoing HRT at stable dose. Plan for mid-cycle fasting early morning labs <10 a.m.  - NEEDLE, DISP, 18 G (BD DISP NEEDLES) 18G X 1-1/2 MISC; 1 each by Does not apply route every 14 (fourteen) days.  Dispense: 100 each; Refill: 0 - NEEDLE, DISP, 22 G (BD DISP NEEDLES) 22G X 1-1/2 MISC; 1 each by Does not apply route every 14 (fourteen) days.  Dispense: 100 each; Refill: 0 - Syringe, Disposable, 3 ML MISC; 1 each by Does not apply route every 14 (fourteen) days.  Dispense: 25 each; Refill: 2 - testosterone  cypionate (DEPOTESTOSTERONE CYPIONATE) 200 MG/ML injection; Inject 1 mL (200 mg total) into the muscle every 14 (fourteen) days. INJECT 1ML  INTRAMUSCULAR EVERY 2 WEEKS  Dispense:  6 mL; Refill: 1 - Testosterone   5. Mixed hyperlipidemia Refill Zetia , check fasting lipids when he presents for testosterone  labs - ezetimibe  (ZETIA ) 10 MG tablet; Take 1 tab daily  Dispense: 90 tablet; Refill: 1 - Lipid panel  6. Screening for colon cancer Discussed pros/cons of Cologuard versus colonoscopy.  Patient would like to proceed with Cologuard. - Cologuard  7. Gastroesophageal reflux disease, unspecified whether esophagitis  present Refill pantoprazole .  We discussed eventual taper off PPI and he verbalizes understanding, plan to discuss this at a future visit. - pantoprazole  (PROTONIX ) 40 MG tablet; Take 1 tablet by mouth daily  Dispense: 90 tablet; Refill: 1  8. Tinea versicolor Refill ketoconazole  for as needed use - ketoconazole  (NIZORAL ) 2 % cream; Apply 1 Application topically daily.  Dispense: 15 g; Refill: 0  9. Seasonal allergic rhinitis due to pollen Refill Zyrtec  - cetirizine  (ZYRTEC ) 10 MG tablet; TAKE 1 TABLET(10 MG) BY MOUTH DAILY  Dispense: 90 tablet; Refill: 0  10. HSV infection Refill Valtrex  for as needed use - valACYclovir  (VALTREX ) 1000 MG tablet; TAKE 1 TABLET BY MOUTH AS NEEDED  Dispense: 30 tablet; Refill: 1     Return in about 6 months (around 11/16/2024).    Cody Das, PA-C, DMSc, Nutritionist [facility] [phone]

## 2024-05-19 ENCOUNTER — Ambulatory Visit: Payer: Self-pay | Admitting: Physician Assistant

## 2024-05-19 ENCOUNTER — Telehealth: Payer: Self-pay

## 2024-05-19 LAB — CYTOLOGY - PAP: Diagnosis: NEGATIVE

## 2024-05-19 NOTE — Telephone Encounter (Signed)
 Please complete PA.  KEY: [id number]  KP

## 2024-05-21 ENCOUNTER — Other Ambulatory Visit (HOSPITAL_COMMUNITY): Payer: Self-pay

## 2024-05-24 ENCOUNTER — Other Ambulatory Visit (HOSPITAL_COMMUNITY): Payer: Self-pay

## 2024-05-24 NOTE — Telephone Encounter (Signed)
 Its ok thank you soo much!!  KP

## 2024-05-24 NOTE — Telephone Encounter (Signed)
 Good morning calling the insurance company this morning to submit, my apologies I was out last week.

## 2024-05-24 NOTE — Telephone Encounter (Signed)
 Checking for a update on this PA.  KP

## 2024-05-25 ENCOUNTER — Telehealth: Payer: Self-pay

## 2024-05-25 NOTE — Telephone Encounter (Signed)
Called pt left VM as a reminder to get labs done.  KP

## 2024-05-26 ENCOUNTER — Other Ambulatory Visit (HOSPITAL_COMMUNITY): Payer: Self-pay

## 2024-05-26 ENCOUNTER — Telehealth: Payer: Self-pay | Admitting: Pharmacy Technician

## 2024-05-26 NOTE — Telephone Encounter (Signed)
 Pharmacy Patient Advocate Encounter   Received notification from Pt Calls Messages that prior authorization for Testosterone  Cyp 200 MG/ML is required/requested.   Insurance verification completed.   The patient is insured through [facility] .   Per test claim: PA required; PA submitted to above mentioned insurance via Fax Key/confirmation #/EOC [id number] Status is pending

## 2024-05-27 ENCOUNTER — Telehealth: Payer: Self-pay

## 2024-05-27 ENCOUNTER — Other Ambulatory Visit (HOSPITAL_COMMUNITY): Payer: Self-pay

## 2024-05-27 NOTE — Telephone Encounter (Signed)
 Noted  KP  Copied from CRM [id number]. Topic: General - Call Back - No Documentation >> May 26, 2024  4:45 PM Tiffany S wrote: Reason for CRM: Patient is out of town for work patient has not started the medication due to leaving patient stated he will schedule when he gets back in town

## 2024-05-27 NOTE — Telephone Encounter (Signed)
 Pharmacy Patient Advocate Encounter  Received notification from [facility] that Prior Authorization for Testosterone  Cyp 200 MG/ML  has been APPROVED and the dates will be on the Approval letter that will be uploaded to pt's media tab as soon as we receive the letter. Ran test claim and pt's copay is $6.04       PA #/Case ID/Reference #: [id number]

## 2024-05-28 NOTE — Telephone Encounter (Signed)
 Received notification from [facility] that Prior Authorization for Testosterone  Cyp 200 MG/ML  has been APPROVED and the dates will be on the Approval letter that will be uploaded to pt's media tab as soon as we receive the letter. Ran test claim and pt's copay is $6.04   KP

## 2024-06-03 ENCOUNTER — Ambulatory Visit
Admission: RE | Admit: 2024-06-03 | Discharge: 2024-06-03 | Disposition: A | Discharge status: Home / Self Care | Source: Ambulatory Visit | Attending: Physician Assistant | Admitting: Physician Assistant

## 2024-06-03 ENCOUNTER — Encounter: Payer: Self-pay | Admitting: Family Medicine

## 2024-06-03 ENCOUNTER — Ambulatory Visit
Admission: RE | Admit: 2024-06-03 | Discharge: 2024-06-03 | Disposition: A | Discharge status: Home / Self Care | Attending: Physician Assistant | Admitting: Physician Assistant

## 2024-06-03 ENCOUNTER — Ambulatory Visit: Admitting: Family Medicine

## 2024-06-03 VITALS — BP 114/76 | HR 70 | Ht 64.0 in | Wt 178.0 lb

## 2024-06-03 DIAGNOSIS — G2589 Other specified extrapyramidal and movement disorders: Secondary | ICD-10-CM | POA: Diagnosis not present

## 2024-06-03 DIAGNOSIS — M24811 Other specific joint derangements of right shoulder, not elsewhere classified: Secondary | ICD-10-CM | POA: Insufficient documentation

## 2024-06-03 DIAGNOSIS — G8929 Other chronic pain: Secondary | ICD-10-CM

## 2024-06-03 DIAGNOSIS — M7591 Shoulder lesion, unspecified, right shoulder: Secondary | ICD-10-CM | POA: Diagnosis not present

## 2024-06-03 MED ORDER — CYCLOBENZAPRINE HCL 10 MG PO TABS: 10.0000 mg | ORAL_TABLET | Freq: Every evening | ORAL | 1 refills | Status: DC | PRN

## 2024-06-03 MED ORDER — MELOXICAM 15 MG PO TABS: 15.0000 mg | ORAL_TABLET | Freq: Every day | ORAL | 1 refills | Status: DC

## 2024-06-03 NOTE — Assessment & Plan Note (Signed)
 History of Present Illness Rick Mason is a 48 year old male who presents with chronic right shoulder pain exacerbated by physical activity.  Right shoulder pain - Chronic right shoulder pain present for several years, onset after initiating a power lifting program - Initially triggered by bench pressing, leading to cessation of this activity - Pain described as a dull ache with a constant burning sensation, particularly with lifting, carrying, or moving the shoulder - Pain localized to the posterior shoulder, extending from the base of the neck along the top of the shoulder and across the shoulder blade - Burning sensation under the shoulder blade, wrapping around underneath the arm - Pain restricts ability to move the head - No numbness, tingling, or shooting pain radiating down the arm past the elbow  Aggravating and alleviating factors - Pain exacerbated by physical activity and decreased physical activity has worsened the pain - Icing the shoulder alleviates the burning sensation under the shoulder blade - Ibuprofen  400-800 mg as needed, taken approximately daily, provides some relief - Physical therapy attended but discontinued due to busy schedule, with persistent pain despite therapy - Massage therapy sought, resulting in bruising under the shoulder blade and described as 'like fire', without significant relief  Functional impairment - Pain impacts daily activities, including difficulty using the right hand for tasks such as clasping a belt or closing pants - Increased reliance on the left hand for daily tasks due to right shoulder pain  Physical Exam PALPATION: Non-tender subacromial space, bicipital groove, and AC joint. Tenderness at the medial and inferior scapular borders. RANGE OF MOTION: Full painless forward flexion and symmetric abduction. Minor posterolateral shoulder pain on terminal abduction right. STRENGTH: 5/5 strength in external and internal rotation, symmetric and  painless.  Isolated supraspinatus testing with 5-/5 strength, mild pain SPECIAL TESTS: Negative Neer's and Yergason's tests on the right. Positive Hawkins and O'Brien's tests on the right.  Results RADIOLOGY Shoulder X-ray: Subtle cortical roughening at the acromioclavicular joint, small calcific changes at the humerus on [facility] view, otherwise no significant degenerative changes or acute osseous abnormalities (06/03/2024)  Assessment and Plan Right shoulder pain with possible labrum injury Chronic right shoulder pain, likely exacerbated by weightlifting, with potential labral involvement. X-rays show normal joint structures with minor calcific changes. Positive O'Brien's test.  Initiate nonsurgical management. - Prescribed meloxicam 15 mg once daily for 1-2 weeks. - Referred to physical therapy for shoulder stabilization exercises focusing on labrum and rotator cuff rehabilitation. - Advised to avoid upper body weightlifting; allow core and lower body exercises. - Consider cortisone injection if pain persists despite medication and physical therapy. - Prescribed Flexeril as needed for muscle relaxation and to aid sleep.

## 2024-06-03 NOTE — Assessment & Plan Note (Signed)
 Right shoulder scapular muscle strain Chronic strain of scapular stabilizing muscles due to compensatory mechanisms for shoulder instability. Tenderness at medial and inferior scapular borders with restricted neck movement. - Incorporate scapular stabilization exercises in physical therapy program. - Use meloxicam and Flexeril as prescribed. - Advise icing the shoulder to alleviate burning sensation.

## 2024-06-03 NOTE — Assessment & Plan Note (Signed)
 Right shoulder supraspinatus tendinitis Mild supraspinatus tendinitis with 5-/5 strength and mild pain during testing, likely secondary to possible labrum injury. No significant structural damage on x-ray. - Include supraspinatus strengthening and stabilization exercises in physical therapy regimen. - Use meloxicam as prescribed. - Monitor response to physical therapy and adjust treatment as necessary.

## 2024-06-03 NOTE — Patient Instructions (Signed)
 Patient Plan for Post-Visit Guidance  1. Medications:    - Take meloxicam 15 mg once daily for 1-2 weeks.    - Use Flexeril as needed for muscle relaxation and to aid sleep.  2. Physical Therapy:    - Attend physical therapy sessions for shoulder stabilization exercises focusing on the labrum, rotator cuff, and scapular stabilization.    - Include supraspinatus strengthening exercises in your therapy regimen.  3. Activity Modifications:    - Avoid upper body weightlifting; focus on core and lower body exercises.    - Ice the shoulder to alleviate the burning sensation.  4. Monitoring and Follow-Up:    - If pain persists despite medication and physical therapy, consider discussing a cortisone injection with your healthcare provider.  Red Flags: - If you experience new or worsening symptoms, such as significant pain or functional impairment, contact your healthcare provider immediately.

## 2024-06-03 NOTE — Progress Notes (Signed)
 Primary Care / Sports Medicine Office Visit  Patient Information:  Patient ID: Rick Mason, adult DOB: [birth year] Age: 48 y.o. MRN: [id number]   Rick Mason is a pleasant 48 y.o. adult presenting with the following:  Chief Complaint  Patient presents with   Shoulder Pain    Chronic right shoulder pain. Patient has constant dull ache with burning. Aggravating factors are lifting, carrying, ROM, and weight lifting. Patient went to PT and it helped but hasn't went in a long time. Patient takes IBU for the pain PRN. Patient has xray's. No injection therapy.     Vitals:   06/03/24 1400  BP: 114/76  Pulse: 70  SpO2: 99%   Vitals:   06/03/24 1400  Weight: 178 lb (80.7 kg)  Height: 5' 4 (1.626 m)   Body mass index is 30.55 kg/m.  DG Shoulder Right Result Date: 06/03/2024 CLINICAL DATA:  Chronic right shoulder pain. EXAM: RIGHT SHOULDER - 2+ VIEW COMPARISON:  None Available. FINDINGS: Mild acromioclavicular joint space narrowing and peripheral osteophytosis. The glenohumeral joint space is maintained. No acute fracture or dislocation. The visualized portion of the right lung is unremarkable. IMPRESSION: Mild acromioclavicular osteoarthritis. Electronically Signed   By: Tanda Lyons M.D.   On: 06/03/2024 15:51     Independent interpretation of notes and tests performed by another provider:   Results RADIOLOGY Right Shoulder X-ray: Subtle cortical roughening at the acromioclavicular joint, small calcific changes at the humerus on [facility] view, otherwise no significant degenerative changes or acute osseous abnormalities (06/03/2024)  Procedures performed:   None  Pertinent History, Exam, Impression, and Recommendations:   Problem List Items Addressed This Visit     Internal derangement of right shoulder - Primary   History of Present Illness Rick Mason is a 48 year old male who presents with chronic right shoulder pain exacerbated by physical activity.  Right  shoulder pain - Chronic right shoulder pain present for several years, onset after initiating a power lifting program - Initially triggered by bench pressing, leading to cessation of this activity - Pain described as a dull ache with a constant burning sensation, particularly with lifting, carrying, or moving the shoulder - Pain localized to the posterior shoulder, extending from the base of the neck along the top of the shoulder and across the shoulder blade - Burning sensation under the shoulder blade, wrapping around underneath the arm - Pain restricts ability to move the head - No numbness, tingling, or shooting pain radiating down the arm past the elbow  Aggravating and alleviating factors - Pain exacerbated by physical activity and decreased physical activity has worsened the pain - Icing the shoulder alleviates the burning sensation under the shoulder blade - Ibuprofen  400-800 mg as needed, taken approximately daily, provides some relief - Physical therapy attended but discontinued due to busy schedule, with persistent pain despite therapy - Massage therapy sought, resulting in bruising under the shoulder blade and described as 'like fire', without significant relief  Functional impairment - Pain impacts daily activities, including difficulty using the right hand for tasks such as clasping a belt or closing pants - Increased reliance on the left hand for daily tasks due to right shoulder pain  Physical Exam PALPATION: Non-tender subacromial space, bicipital groove, and AC joint. Tenderness at the medial and inferior scapular borders. RANGE OF MOTION: Full painless forward flexion and symmetric abduction. Minor posterolateral shoulder pain on terminal abduction right. STRENGTH: 5/5 strength in external and internal rotation, symmetric and painless.  Isolated supraspinatus testing with 5-/5 strength, mild pain SPECIAL TESTS: Negative Neer's and Yergason's tests on the right. Positive  Hawkins and O'Brien's tests on the right.  Results RADIOLOGY Shoulder X-ray: Subtle cortical roughening at the acromioclavicular joint, small calcific changes at the humerus on [facility] view, otherwise no significant degenerative changes or acute osseous abnormalities (06/03/2024)  Assessment and Plan Right shoulder pain with possible labrum injury Chronic right shoulder pain, likely exacerbated by weightlifting, with potential labral involvement. X-rays show normal joint structures with minor calcific changes. Positive O'Brien's test.  Initiate nonsurgical management. - Prescribed meloxicam 15 mg once daily for 1-2 weeks. - Referred to physical therapy for shoulder stabilization exercises focusing on labrum and rotator cuff rehabilitation. - Advised to avoid upper body weightlifting; allow core and lower body exercises. - Consider cortisone injection if pain persists despite medication and physical therapy. - Prescribed Flexeril as needed for muscle relaxation and to aid sleep.      Relevant Orders   Ambulatory referral to Physical Therapy   Scapular dyskinesis   Right shoulder scapular muscle strain Chronic strain of scapular stabilizing muscles due to compensatory mechanisms for shoulder instability. Tenderness at medial and inferior scapular borders with restricted neck movement. - Incorporate scapular stabilization exercises in physical therapy program. - Use meloxicam and Flexeril as prescribed. - Advise icing the shoulder to alleviate burning sensation.      Relevant Orders   Ambulatory referral to Physical Therapy   Supraspinatus tendinitis, right   Right shoulder supraspinatus tendinitis Mild supraspinatus tendinitis with 5-/5 strength and mild pain during testing, likely secondary to possible labrum injury. No significant structural damage on x-ray. - Include supraspinatus strengthening and stabilization exercises in physical therapy regimen. - Use meloxicam as  prescribed. - Monitor response to physical therapy and adjust treatment as necessary.      Relevant Orders   Ambulatory referral to Physical Therapy     Orders & Medications Medications:  Meds ordered this encounter  Medications   meloxicam (MOBIC) 15 MG tablet    Sig: Take 1 tablet (15 mg total) by mouth daily.    Dispense:  30 tablet    Refill:  1   cyclobenzaprine (FLEXERIL) 10 MG tablet    Sig: Take 1 tablet (10 mg total) by mouth at bedtime as needed for muscle spasms.    Dispense:  30 tablet    Refill:  1   Orders Placed This Encounter  Procedures   Ambulatory referral to Physical Therapy     No follow-ups on file.     Selinda JINNY Ku, MD, [facility]   Primary Care Sports Medicine Primary Care and Sports Medicine at [facility]

## 2024-07-08 LAB — COLOGUARD: COLOGUARD: NEGATIVE

## 2024-08-19 ENCOUNTER — Telehealth: Payer: Self-pay | Admitting: Physician Assistant

## 2024-08-19 ENCOUNTER — Ambulatory Visit: Admitting: Family Medicine

## 2024-08-19 NOTE — Telephone Encounter (Signed)
Duplicate   KP 

## 2024-08-19 NOTE — Telephone Encounter (Signed)
 Copied from CRM [id number]. Topic: Referral - Status >> Aug 18, 2024  5:52 PM Zebedee SAUNDERS wrote: Reason for CRM: Pt needs referral#[id number] redirected to pt's area of [place] [address]. Please call pt at [phone] when completed.

## 2024-08-19 NOTE — Telephone Encounter (Signed)
 Please send referral to and area in [place].  KP

## 2024-08-19 NOTE — Telephone Encounter (Signed)
 Copied from CRM [id number]. Topic: General - Other >> Aug 18, 2024  5:46 PM Rick Mason wrote: Reason for CRM: Pt called stated he rescheduled appt on 09/10/2024 for 2:00 p.m with Dr. Selinda Ku he stated it went from 40 minutes to 20 minutes. Please call [phone] pt to discuss time change.

## 2024-08-19 NOTE — Telephone Encounter (Signed)
 Patient appt needs to be 40 minutes.  Thank you.  JM

## 2024-09-10 ENCOUNTER — Ambulatory Visit: Admitting: Physician Assistant

## 2024-09-10 ENCOUNTER — Ambulatory Visit: Admitting: Family Medicine

## 2024-09-10 ENCOUNTER — Encounter: Payer: Self-pay | Admitting: Family Medicine

## 2024-09-10 ENCOUNTER — Telehealth: Payer: Self-pay

## 2024-09-10 VITALS — BP 118/72 | HR 88 | Ht 64.0 in | Wt 179.0 lb

## 2024-09-10 DIAGNOSIS — G2589 Other specified extrapyramidal and movement disorders: Secondary | ICD-10-CM

## 2024-09-10 MED ORDER — CYCLOBENZAPRINE HCL 10 MG PO TABS: 10.0000 mg | ORAL_TABLET | Freq: Every evening | ORAL | 0 refills | Status: DC | PRN

## 2024-09-10 MED ORDER — DICLOFENAC SODIUM 50 MG PO TBEC: 50.0000 mg | DELAYED_RELEASE_TABLET | Freq: Two times a day (BID) | ORAL | 0 refills | Status: DC | PRN

## 2024-09-10 NOTE — Patient Instructions (Signed)
 VISIT SUMMARY:  Today, we discussed your ongoing right shoulder pain and dysfunction, which has been improving with physical therapy and medication. We also addressed the muscle pain in your upper back and shoulder region.  YOUR PLAN:  RIGHT SHOULDER PAIN DUE TO SCAPULAR DYSKINESIS AND ROTATOR CUFF INVOLVEMENT: You have chronic right shoulder pain due to issues with your shoulder blade movement and rotator cuff. Your condition is improving with therapy and medication. -Continue physical therapy in [place]. -Request your physical therapy notes to be forwarded to the sports medicine office. -Start taking diclofenac  50 mg twice daily as needed, with food, and stop taking meloxicam . -Continue taking cyclobenzaprine  as needed for muscle relaxation. -Discuss dry needling with your physical therapist if symptoms persist. -Consider a trigger point injection if symptoms remain stubborn after further physical therapy sessions. Contact us  for scheduling if wanting to pursue this.  MYALGIA OF RIGHT UPPER BACK AND SHOULDER REGION: You have muscle pain in your right upper back and shoulder, particularly in the levator scapula and rhomboids. Your symptoms are improving with the current treatment. -Continue your current treatment regimen with diclofenac  and cyclobenzaprine . -Consider dry needling for persistent muscle tightness. -Monitor your symptoms and report any persistent or worsening pain.

## 2024-09-10 NOTE — Assessment & Plan Note (Signed)
 History of Present Illness Rick Mason is a 48 year old male who presents with right shoulder pain and dysfunction.  Right shoulder pain and dysfunction - Ongoing pain and dysfunction localized to the right shoulder and scapular region - Burning sensations in the scapula and tightness along the shoulder area - Pain is not severe but present with certain movements - Improved range of motion in the past 1-2 weeks, with increased ability to turn the shoulder - Nocturnal pain absent; able to sleep through the night without being woken by shoulder pain - Avoids sleeping on the affected shoulder  Physical therapy response - Recently initiated physical therapy in [place] after completing two work assignments out of town - Attended an evaluation and two physical therapy sessions - Therapy includes exercises such as shoulder squeezing and manual manipulation on a massage table, which are painful but necessary - Therapy is addressing forward displacement of the shoulder, observed by his wife as moving forward a couple of inches  Inflammation and symptom management - Uses meloxicam  and a muscle relaxer, typically at the end of the day, which have reduced inflammation - Applies ice and a hot water bottle to the neck at night, which is beneficial - Inflammation has nearly resolved  Functional status and activity limitations - Desires to return to the gym but anticipates challenges with resuming gym activities - Pain and range of motion have improved, but some dysfunction persists  Physical Exam PALPATION: Tenderness at the right levator scapula and along the medial scapular border and rhomboids on the right. RANGE OF MOTION: Improved range of motion in shoulder. STRENGTH: Supraspinatus strength 5/5, painless - interval improved. External and internal rotation 5/5 strength, painless. SPECIAL TESTS: Positive Hawkins and O'Brien's tests. Negative Neer's test on the right. Now negative O'Brien's test  with interval improvement.  Assessment and Plan Right shoulder pain due to scapular dyskinesis and rotator cuff involvement Chronic right shoulder pain due to scapular dyskinesis and supraspinatus involvement. Positive Hawkins test indicates persistent impingement. Condition improving with therapy and medication. - Continue physical therapy in [place]. - Request PT notes to be forwarded to the sports medicine office. - Prescribe diclofenac  50 mg twice daily as needed, with food, and pause meloxicam . - Continue cyclobenzaprine  as needed for muscle relaxation. - Discuss dry needling with PT if symptoms persist. - Consider trigger point injection if symptoms remain stubborn after further PT sessions.  Myalgia of right upper back and shoulder region Myalgia in right upper back and shoulder, particularly levator scapula and rhomboids. Symptoms improving with current treatment. - Continue current treatment regimen with newly prescribed diclofenac  and cyclobenzaprine . - Consider dry needling for persistent muscle tightness. - Monitor symptoms and report any persistent or worsening pain.

## 2024-09-10 NOTE — Progress Notes (Signed)
 Primary Care / Sports Medicine Office Visit  Patient Information:  Patient ID: Rick Mason, adult DOB: [birth year] Age: 48 y.o. MRN: [id number]   Rick Mason is a pleasant 48 y.o. adult presenting with the following:  Chief Complaint  Patient presents with   Shoulder Pain    Vitals:   09/10/24 1411  BP: 118/72  Pulse: 88  SpO2: 98%   Vitals:   09/10/24 1411  Weight: 179 lb (81.2 kg)  Height: 5' 4 (1.626 m)   Body mass index is 30.73 kg/m.  No results found.   Discussed the use of AI scribe software for clinical note transcription with the patient, who gave verbal consent to proceed.   Independent interpretation of notes and tests performed by another provider:   None  Procedures performed:   None  Pertinent History, Exam, Impression, and Recommendations:   Problem List Items Addressed This Visit     Scapular dyskinesis - Primary   History of Present Illness Rick Mason is a 48 year old male who presents with right shoulder pain and dysfunction.  Right shoulder pain and dysfunction - Ongoing pain and dysfunction localized to the right shoulder and scapular region - Burning sensations in the scapula and tightness along the shoulder area - Pain is not severe but present with certain movements - Improved range of motion in the past 1-2 weeks, with increased ability to turn the shoulder - Nocturnal pain absent; able to sleep through the night without being woken by shoulder pain - Avoids sleeping on the affected shoulder  Physical therapy response - Recently initiated physical therapy in [place] after completing two work assignments out of town - Attended an evaluation and two physical therapy sessions - Therapy includes exercises such as shoulder squeezing and manual manipulation on a massage table, which are painful but necessary - Therapy is addressing forward displacement of the shoulder, observed by his wife as moving forward a couple of  inches  Inflammation and symptom management - Uses meloxicam  and a muscle relaxer, typically at the end of the day, which have reduced inflammation - Applies ice and a hot water bottle to the neck at night, which is beneficial - Inflammation has nearly resolved  Functional status and activity limitations - Desires to return to the gym but anticipates challenges with resuming gym activities - Pain and range of motion have improved, but some dysfunction persists  Physical Exam PALPATION: Tenderness at the right levator scapula and along the medial scapular border and rhomboids on the right. RANGE OF MOTION: Improved range of motion in shoulder. STRENGTH: Supraspinatus strength 5/5, painless - interval improved. External and internal rotation 5/5 strength, painless. SPECIAL TESTS: Positive Hawkins and O'Brien's tests. Negative Neer's test on the right. Now negative O'Brien's test with interval improvement.  Assessment and Plan Right shoulder pain due to scapular dyskinesis and rotator cuff involvement Chronic right shoulder pain due to scapular dyskinesis and supraspinatus involvement. Positive Hawkins test indicates persistent impingement. Condition improving with therapy and medication. - Continue physical therapy in [place]. - Request PT notes to be forwarded to the sports medicine office. - Prescribe diclofenac  50 mg twice daily as needed, with food, and pause meloxicam . - Continue cyclobenzaprine  as needed for muscle relaxation. - Discuss dry needling with PT if symptoms persist. - Consider trigger point injection if symptoms remain stubborn after further PT sessions.  Myalgia of right upper back and shoulder region Myalgia in right upper back and shoulder, particularly levator scapula and rhomboids. Symptoms  improving with current treatment. - Continue current treatment regimen with newly prescribed diclofenac  and cyclobenzaprine . - Consider dry needling for persistent muscle  tightness. - Monitor symptoms and report any persistent or worsening pain.      Relevant Medications   diclofenac  (VOLTAREN ) 50 MG EC tablet   cyclobenzaprine  (FLEXERIL ) 10 MG tablet     Orders & Medications Medications:  Meds ordered this encounter  Medications   diclofenac  (VOLTAREN ) 50 MG EC tablet    Sig: Take 1 tablet (50 mg total) by mouth 2 (two) times daily as needed.    Dispense:  60 tablet    Refill:  0   cyclobenzaprine  (FLEXERIL ) 10 MG tablet    Sig: Take 1 tablet (10 mg total) by mouth at bedtime as needed for muscle spasms.    Dispense:  30 tablet    Refill:  0   No orders of the defined types were placed in this encounter.    No follow-ups on file.     Rick JINNY Ku, MD, [facility]   Primary Care Sports Medicine Primary Care and Sports Medicine at [facility]

## 2024-09-10 NOTE — Telephone Encounter (Signed)
 Copied from CRM [id number]. Topic: General - Running Late >> Sep 10, 2024  2:01 PM Harlene ORN wrote: Patient/patient representative is calling because they are running late for an appointment.

## 2024-09-17 ENCOUNTER — Ambulatory Visit: Payer: Self-pay | Admitting: Physician Assistant

## 2024-09-17 NOTE — Telephone Encounter (Signed)
 FYI Only or Action Required?: Action required by provider: medication refill request.  Patient was last seen in primary care on 09/10/2024 by Alvia Selinda PARAS, MD.  Called Nurse Triage reporting Medication Problem.  Triage Disposition: Call PCP When Office is Open  Patient/caregiver understands and will follow disposition?: Yes           Copied from CRM #[id number]. Topic: Clinical - Medication Question >> Sep 17, 2024 12:37 PM Shanda MATSU wrote: Reason for CRM: Patient is calling back wanting to know if he can be placed back on his old med, meloxicam  (MOBIC ) 15 MG tablet, patient reports the new med prescribed, diclofenac  (VOLTAREN ) 50 MG EC tablet, does not work as well as the meloxicam  (MOBIC ) 15 MG tablet, is req a call back in regards to this. Reason for Disposition  [1] Prescription refill request for NON-ESSENTIAL medicine (i.e., no harm to patient if med not taken) AND [2] triager unable to refill per department policy  Answer Assessment - Initial Assessment Questions Pt is requesting to resume meloxicam  15 mg instead of diclofenac  50 mg. Pt reports Dr. Alvia advised him to call if the diclofenac  was not as effective, and he would restart the meloxicam . This RN will send a high-priority message to the clinic for review.  Preferred Pharmacy: [facility] [address] [place] [address] [place], [place] [address] Phone: [phone] Fax: [phone]  Protocols used: Medication Refill and Renewal Call-A-AH

## 2024-09-17 NOTE — Telephone Encounter (Signed)
 Please review.  KP

## 2024-09-27 ENCOUNTER — Other Ambulatory Visit: Payer: Self-pay | Admitting: Family Medicine

## 2024-09-27 MED ORDER — MELOXICAM 15 MG PO TABS: 15.0000 mg | ORAL_TABLET | Freq: Every day | ORAL | 0 refills | Status: DC | PRN

## 2024-10-22 ENCOUNTER — Ambulatory Visit: Admitting: Family Medicine

## 2024-10-26 ENCOUNTER — Ambulatory Visit: Admitting: Family Medicine

## 2024-10-26 ENCOUNTER — Encounter: Payer: Self-pay | Admitting: Physician Assistant

## 2024-11-09 ENCOUNTER — Other Ambulatory Visit: Payer: Self-pay | Admitting: Family Medicine

## 2024-11-09 DIAGNOSIS — G2589 Other specified extrapyramidal and movement disorders: Secondary | ICD-10-CM

## 2024-11-11 ENCOUNTER — Telehealth: Payer: Self-pay

## 2024-11-11 NOTE — Telephone Encounter (Signed)
 Requested medication (s) are due for refill today - yes  Requested medication (s) are on the active medication list -yes  Future visit scheduled -yes  Last refill: Meloxicam - 09/27/24 #30- fails lab protocol- over 1 year-02/19/23                 Cyclobenzaprine - 09/10/24 #30- non delegated Rx  Notes to clinic: see above  Requested Prescriptions  Pending Prescriptions Disp Refills   meloxicam  (MOBIC ) 15 MG tablet [Pharmacy Med Name: MELOXICAM  15MG  TABLETS] 30 tablet 0    Sig: TAKE 1 TABLET(15 MG) BY MOUTH DAILY AS NEEDED FOR PAIN     Analgesics:  COX2 Inhibitors Failed - 11/11/2024  2:06 PM      Failed - Manual Review: Labs are only required if the patient has taken medication for more than 8 weeks.      Failed - HGB in normal range and within 360 days    Hemoglobin  Date Value Ref Range Status  06/14/2019 16.9 13.0 - 17.7 g/dL Final         Failed - Cr in normal range and within 360 days    Creatinine, Ser  Date Value Ref Range Status  02/19/2023 1.03 0.76 - 1.27 mg/dL Final         Failed - HCT in normal range and within 360 days    No results found for: HCT, HCTKUC, SRHCT       Failed - AST in normal range and within 360 days    AST  Date Value Ref Range Status  02/19/2023 24 0 - 40 IU/L Final         Failed - ALT in normal range and within 360 days    ALT  Date Value Ref Range Status  02/19/2023 28 0 - 44 IU/L Final         Failed - eGFR is 30 or above and within 360 days    GFR calc Af Amer  Date Value Ref Range Status  08/28/2020 122 >59 mL/min/1.73 Final    Comment:    **[facility] currently reports eGFR in compliance with the current**   recommendations of the [employer]. [facility] will   update reporting as new guidelines are published from the NKF-ASN   Task force.    GFR calc non Af Amer  Date Value Ref Range Status  08/28/2020 106 >59 mL/min/1.73 Final   eGFR  Date Value Ref Range Status  02/19/2023 92 >59 mL/min/1.73 Final          Passed - Patient is not pregnant      Passed - Valid encounter within last 12 months    Recent Outpatient Visits           2 months ago Scapular dyskinesis   [place] [facility], Selinda PARAS, [place]   5 months ago Internal derangement of right shoulder   [place] [facility] at [facility], Selinda PARAS, MD   5 months ago Annual physical exam   [facility] Medicine at [facility], Toribio SQUIBB, [place]       Future Appointments             In 6 months Manya, Toribio SQUIBB, [place] [facility] Primary Care & Sports Medicine at [facility], [phone] [place]             cyclobenzaprine  (FLEXERIL ) 10 MG tablet [Pharmacy Med Name:  CYCLOBENZAPRINE  10MG  TABLETS] 30 tablet 0    Sig: TAKE 1 TABLET(10 MG) BY MOUTH AT BEDTIME AS NEEDED FOR MUSCLE SPASMS     Not Delegated - Analgesics:  Muscle Relaxants Failed - 11/11/2024  2:06 PM      Failed - This refill cannot be delegated      Passed - Valid encounter within last 6 months    Recent Outpatient Visits           2 months ago Scapular dyskinesis   [place] [facility] at [facility], Selinda PARAS, MD   5 months ago Internal derangement of right shoulder   [place] [facility] at [facility], Selinda PARAS, MD   5 months ago Annual physical exam   [facility] at [facility], Toribio SQUIBB, [place]       Future Appointments             In 6 months Manya, Toribio SQUIBB, [place] [facility] Primary Care & Sports Medicine at [facility], [phone] [place]               Requested Prescriptions  Pending Prescriptions Disp Refills   meloxicam  (MOBIC ) 15 MG tablet [Pharmacy Med Name: MELOXICAM  15MG  TABLETS] 30 tablet 0    Sig: TAKE 1 TABLET(15 MG) BY MOUTH DAILY AS NEEDED FOR PAIN     Analgesics:  COX2 Inhibitors Failed - 11/11/2024  2:06  PM      Failed - Manual Review: Labs are only required if the patient has taken medication for more than 8 weeks.      Failed - HGB in normal range and within 360 days    Hemoglobin  Date Value Ref Range Status  06/14/2019 16.9 13.0 - 17.7 g/dL Final         Failed - Cr in normal range and within 360 days    Creatinine, Ser  Date Value Ref Range Status  02/19/2023 1.03 0.76 - 1.27 mg/dL Final         Failed - HCT in normal range and within 360 days    No results found for: HCT, HCTKUC, SRHCT       Failed - AST in normal range and within 360 days    AST  Date Value Ref Range Status  02/19/2023 24 0 - 40 IU/L Final         Failed - ALT in normal range and within 360 days    ALT  Date Value Ref Range Status  02/19/2023 28 0 - 44 IU/L Final         Failed - eGFR is 30 or above and within 360 days    GFR calc Af Amer  Date Value Ref Range Status  08/28/2020 122 >59 mL/min/1.73 Final    Comment:    **[facility] currently reports eGFR in compliance with the current**   recommendations of the [employer]. [facility] will   update reporting as new guidelines are published from the NKF-ASN   Task force.    GFR calc non Af Amer  Date Value Ref Range Status  08/28/2020 106 >59 mL/min/1.73 Final   eGFR  Date Value Ref Range Status  02/19/2023 92 >59 mL/min/1.73 Final         Passed - Patient is not pregnant      Passed - Valid encounter within last 12 months    Recent Outpatient Visits  2 months ago Scapular dyskinesis   [facility] Medicine at [facility], Selinda PARAS, MD   5 months ago Internal derangement of right shoulder   [place] [facility] at [facility], Selinda PARAS, MD   5 months ago Annual physical exam   [facility] Primary Care & Sports Medicine at [facility], Toribio [place]QU[place], [place]       Future Appointments             In 6 months Manya, Torib[place] SQUIBB, [place]  [facility] & Sports Medicine at [facility], [phone] [place]             cyclobenzaprine  (FLEXERIL ) 10 MG tablet [Pharmacy Med Name: CYCLOBENZAPRINE  10MG  TABLETS] 30 tablet 0    Sig: TAKE 1 TABLET(10 MG) BY MOUTH AT BEDTIME AS NEEDED FOR MUSCLE SPASMS     Not Delegated - Analgesics:  Muscle Relaxants Failed - 11/11/2024  2:06 PM      Failed - This refill cannot be delegated      Passed - Valid encounter within last 6 months    Recent Outpatient Visits           2 months ago Scapular dyskinesis   [facility] at [facility], Selinda PARAS, MD   5 months ago Internal derangement of right shoulder   [place] [facility] at [facility], Selinda PARAS, MD   5 months ago Annual physical exam   [facility] Primary Care & Sports Medicine at [facility], Toribio SQUIBB, [place]       Future Appointments             In 6 months Manya, Toribio SQUIBB, [place] [facility] & Sports Medicine at [facility], [phone] [place]

## 2024-11-11 NOTE — Telephone Encounter (Signed)
 Copied from CRM #[id number]. Topic: Appointments - Scheduling Inquiry for Clinic >> Nov 11, 2024  8:05 AM Kendralyn S wrote: Reason for CRM: wants to schedule another ortho follow up with matthews since he missed his 11/24

## 2024-11-11 NOTE — Telephone Encounter (Signed)
 Left voice mail to set up appointment with dr alvia follow up ortho

## 2024-11-16 ENCOUNTER — Telehealth: Payer: Self-pay | Admitting: Physician Assistant

## 2024-11-16 ENCOUNTER — Encounter: Payer: Self-pay | Admitting: Physician Assistant

## 2024-11-16 ENCOUNTER — Other Ambulatory Visit: Payer: Self-pay | Admitting: Physician Assistant

## 2024-11-16 ENCOUNTER — Ambulatory Visit: Admitting: Physician Assistant

## 2024-11-16 VITALS — BP 104/82 | HR 86 | Temp 98.2°F | Ht 64.0 in | Wt 182.0 lb

## 2024-11-16 DIAGNOSIS — F640 Transsexualism: Secondary | ICD-10-CM

## 2024-11-16 DIAGNOSIS — J301 Allergic rhinitis due to pollen: Secondary | ICD-10-CM | POA: Insufficient documentation

## 2024-11-16 DIAGNOSIS — Z79899 Other long term (current) drug therapy: Secondary | ICD-10-CM

## 2024-11-16 DIAGNOSIS — E782 Mixed hyperlipidemia: Secondary | ICD-10-CM

## 2024-11-16 DIAGNOSIS — K219 Gastro-esophageal reflux disease without esophagitis: Secondary | ICD-10-CM | POA: Insufficient documentation

## 2024-11-16 MED ORDER — FAMOTIDINE 20 MG PO TABS: 20.0000 mg | ORAL_TABLET | Freq: Every day | ORAL | 0 refills | Status: AC | PRN

## 2024-11-16 MED ORDER — EZETIMIBE 10 MG PO TABS: ORAL_TABLET | ORAL | 1 refills | Status: AC

## 2024-11-16 MED ORDER — CETIRIZINE HCL 10 MG PO TABS: ORAL_TABLET | ORAL | 3 refills | Status: AC

## 2024-11-16 NOTE — Telephone Encounter (Signed)
 Copied from CRM #[id number]. Topic: Appointments - Scheduling Inquiry for Clinic >> Nov 16, 2024 12:40 PM Selinda RAMAN wrote: Reason for CRM: The patient called in returning a call to set up a follow up Ortho appt but I did see where he missed his last appointment. I did set him up for next Tuesday the 23rd at 10:20. Hopefully that is ok because I saw a note from [place] stating a voice  mail was left for the patient to call in and set up a follow up ortho appointment. Please assist patient further

## 2024-11-16 NOTE — Progress Notes (Signed)
 Date:  11/16/2024   Name:  Rick Mason   DOB:  12/03/1975   MRN:  [id number]   Chief Complaint: Medical Management of Chronic Issues  HPI  Rick Mason is a pleasant 48 year old transgender male with a history of HTN, HLD, GERD, and ongoing testosterone  therapy returning for routine follow up on chronic conditions. He never returned for lab work after his last visit with me 6 months ago, but he is fasting today and reports last dose of testosterone  was about 10 days ago.  He is interested in attempting to taper off PPI which he has never tried before. Pantoprazole  was started several years ago for his GERD and he has been taking it daily since.   Medication list has been reviewed and updated.  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 11/16/2024   Internal derangement of right shoulder 06/03/2024   Scapular dyskinesis 06/03/2024   Supraspinatus tendinitis, right 06/03/2024   HSV infection 05/17/2024   Stomatitis 04/20/2018   Cervical radiculopathy 04/20/2018   Hormone replacement therapy (HRT) 04/20/2018   Hypogonadism male 05/01/2017   Mixed hyperlipidemia 05/01/2017    Allergies[2]  Immunization History  Administered Date(s) Administered   Influenza, Seasonal, Injecte, Preservative Fre 10/21/2023   Influenza,inj,Quad PF,6+ Mos 07/31/2017, 09/02/2019, 08/28/2020, 02/19/2023   Influenza-Unspecified 11/26/2021   Moderna Sars-Covid-2 Vaccination 02/10/2020, 03/09/2020, 11/26/2021   Pfizer(Comirnaty)Fall Seasonal Vaccine 12 years and older 10/21/2023   Tdap 12/08/2018    Past Surgical History:  Procedure Laterality Date   MASTECTOMY Bilateral     Social History[3]  Family History  Problem Relation Age of Onset    Cancer Mother    Diabetes Father    Heart disease Father    Pulmonary fibrosis Father    Cancer Maternal Grandfather    Heart disease Maternal Grandfather    Diabetes Paternal Grandmother    Cancer Paternal Grandmother    Cancer Paternal Grandfather         11/16/2024    9:21 AM 09/10/2024    2:12 PM 05/17/2024    2:16 PM 10/21/2023    2:32 PM  GAD 7 : Generalized Anxiety Score  Nervous, Anxious, on Edge 0 0 1 0  Control/stop worrying 0 0 1 0  Worry too much - different things 0 0 1 0  Trouble relaxing 0 0 0 0  Restless 0 0 0 0  Easily annoyed or irritable 0 0 0 0  Afraid - awful might happen 0 0 0 0  Total GAD 7 Score 0 0 3 0  Anxiety Difficulty Not difficult at all Not difficult at all Not difficult at all        11/16/2024    9:21 AM 09/10/2024    2:12 PM 05/17/2024    2:15 PM  Depression screen PHQ 2/9  Decreased Interest 0 0 0  Down, Depressed, Hopeless 0 0 0  PHQ - 2 Score 0 0 0  Altered sleeping  0   Tired, decreased energy  0   Change in appetite  0   Feeling bad or failure about yourself   0   Trouble concentrating  0   Moving slowly or fidgety/restless  0   Suicidal thoughts  0   PHQ-9 Score  0  Difficult doing work/chores  Not difficult at all      Data saved with a previous flowsheet row definition    BP Readings from Last 3 Encounters:  11/16/24 104/82  09/10/24 118/72  06/03/24 114/76    Wt Readings from Last 3 Encounters:  11/16/24 182 lb (82.6 kg)  09/10/24 179 lb (81.2 kg)  06/03/24 178 lb (80.7 kg)    BP 104/82   Pulse 86   Temp 98.2 F (36.8 C)   Ht 5' 4 (1.626 m)   Wt 182 lb (82.6 kg)   SpO2 99%   BMI 31.24 kg/m   Physical Exam Vitals and nursing note reviewed.  Constitutional:      Appearance: Normal appearance.  Cardiovascular:     Rate and Rhythm: Normal rate.  Pulmonary:     Effort: Pulmonary effort is normal.  Abdominal:     General: There is no distension.  Musculoskeletal:        General: Normal range of  motion.  Skin:    General: Skin is warm and dry.  Neurological:     Mental Status: He is alert and oriented to person, place, and time.     Gait: Gait is intact.  Psychiatric:        Mood and Affect: Mood and affect normal.     Recent Labs     Component Value Date/Time   NA 140 02/19/2023 1200   K 4.5 02/19/2023 1200   CL 103 02/19/2023 1200   CO2 23 02/19/2023 1200   GLUCOSE 90 02/19/2023 1200   BUN 14 02/19/2023 1200   CREATININE 1.03 02/19/2023 1200   CALCIUM 9.0 02/19/2023 1200   PROT 6.8 02/19/2023 1200   ALBUMIN 4.3 02/19/2023 1200   AST 24 02/19/2023 1200   ALT 28 02/19/2023 1200   ALKPHOS 57 02/19/2023 1200   BILITOT 0.4 02/19/2023 1200   GFRNONAA 106 08/28/2020 1458   GFRAA 122 08/28/2020 1458    Lab Results  Component Value Date   HGB 16.9 06/14/2019   No results found for: HGBA1C Lab Results  Component Value Date   CHOL 209 (H) 10/21/2023   HDL 43 10/21/2023   LDLCALC 143 (H) 10/21/2023   LDLDIRECT 155 (H) 07/02/2021   TRIG 127 10/21/2023   CHOLHDL 4.9 10/21/2023   No results found for: TSH    Assessment and Plan:  Mixed hyperlipidemia Assessment & Plan: Check fasting lipids, refilling Zetia  today  Orders: -     Ezetimibe ; Take 1 tab daily  Dispense: 90 tablet; Refill: 1  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    Gastroesophageal reflux disease, unspecified whether esophagitis present Assessment & Plan: Reviewed in detail how to assist taper with famotidine . He will attempt this.   Orders: -     Famotidine ; Take 1 tablet (20 mg total) by mouth daily as needed for heartburn or indigestion.  Dispense: 30 tablet; Refill: 0  Long-term current use of proton pump inhibitor therapy Assessment & Plan: Attempt cross-taper  Orders: -     Famotidine ; Take 1 tablet (20 mg total) by mouth daily as needed for heartburn or indigestion.   Dispense: 30 tablet; Refill: 0  Seasonal allergic rhinitis due to pollen Assessment & Plan: Refill cetirizine   Orders: -     Cetirizine  HCl; TAKE 1 TABLET(10 MG) BY MOUTH DAILY  Dispense: 90 tablet; Refill: 3     No follow-ups on file.  Rolan Hoyle, PA-C, DMSc, Nutritionist [facility] [phone]      [1]  Current Meds  Medication Sig   cyclobenzaprine  (FLEXERIL ) 10 MG tablet TAKE 1 TABLET(10 MG) BY MOUTH AT BEDTIME AS NEEDED FOR MUSCLE SPASMS   diclofenac  Sodium (VOLTAREN ) 1 % GEL Apply 2 g topically 4 (four) times daily. Use on affected joint up to 4x/day as needed. 2 grams is roughly 4 fingertips' worth of gel.   famotidine  (PEPCID ) 20 MG tablet Take 1 tablet (20 mg total) by mouth daily as needed for heartburn or indigestion.   ketoconazole  (NIZORAL ) 2 % cream Apply 1 Application topically daily.   meloxicam  (MOBIC ) 15 MG tablet TAKE 1 TABLET(15 MG) BY MOUTH DAILY AS NEEDED FOR PAIN   NEEDLE, DISP, 18 G (BD DISP NEEDLES) 18G X 1-1/2 MISC 1 each by Does not apply route every 14 (fourteen) days.   NEEDLE, DISP, 22 G (BD DISP NEEDLES) 22G X 1-1/2 MISC 1 each by Does not apply route every 14 (fourteen) days.   pantoprazole  (PROTONIX ) 40 MG tablet Take 1 tablet by mouth daily   Syringe, Disposable, 3 ML MISC 1 each by Does not apply route every 14 (fourteen) days.   testosterone  cypionate (DEPOTESTOSTERONE CYPIONATE) 200 MG/ML injection Inject 1 mL (200 mg total) into the muscle every 14 (fourteen) days. INJECT 1ML  INTRAMUSCULAR EVERY 2 WEEKS   valACYclovir  (VALTREX ) 1000 MG tablet TAKE 1 TABLET BY MOUTH AS NEEDED   [DISCONTINUED] cetirizine  (ZYRTEC ) 10 MG tablet TAKE 1 TABLET(10 MG) BY MOUTH DAILY   [DISCONTINUED] ezetimibe  (ZETIA ) 10 MG tablet Take 1 tab daily   [DISCONTINUED] Triamcinolone Acetonide (NASACORT AQ NA) Place into the nose. otc  [2]  Allergies Allergen Reactions    Penicillins   [3]  Social History Tobacco Use   Smoking status: Former    Current packs/day: 0.00    Average packs/day: 0.5 packs/day for 4.0 years (2.0 ttl pk-yrs)    Types: Cigarettes    Start date: 21    Quit date: 2000    Years since quitting: 25.9   Smokeless tobacco: Never  Vaping Use   Vaping status: Every Day   Substances: CBD  Substance Use Topics   Alcohol use: No    Alcohol/week: 0.0 standard drinks of alcohol   Drug use: Yes    Types: Marijuana

## 2024-11-16 NOTE — Assessment & Plan Note (Signed)
 Check fasting lipids, refilling Zetia  today

## 2024-11-16 NOTE — Assessment & Plan Note (Signed)
 Check midcycle fasting testosterone  today along with other previously ordered labs.

## 2024-11-16 NOTE — Assessment & Plan Note (Signed)
 Reviewed in detail how to assist taper with famotidine . He will attempt this.

## 2024-11-16 NOTE — Assessment & Plan Note (Signed)
 Attempt cross-taper

## 2024-11-16 NOTE — Assessment & Plan Note (Signed)
 Refill- cetirizine

## 2024-11-17 ENCOUNTER — Other Ambulatory Visit: Payer: Self-pay | Admitting: Physician Assistant

## 2024-11-17 DIAGNOSIS — Z79890 Hormone replacement therapy (postmenopausal): Secondary | ICD-10-CM

## 2024-11-17 LAB — CBC WITH DIFFERENTIAL/PLATELET
Basophils Absolute: 0.1 x10E3/uL (ref 0.0–0.2)
Basos: 1 %
EOS (ABSOLUTE): 0.2 x10E3/uL (ref 0.0–0.4)
Eos: 3 %
Hematocrit: 49.5 % (ref 37.5–51.0)
Hemoglobin: 16.6 g/dL (ref 13.0–17.7)
Immature Grans (Abs): 0 x10E3/uL (ref 0.0–0.1)
Immature Granulocytes: 0 %
Lymphocytes Absolute: 1.6 x10E3/uL (ref 0.7–3.1)
Lymphs: 22 %
MCH: 31.8 pg (ref 26.6–33.0)
MCHC: 33.5 g/dL (ref 31.5–35.7)
MCV: 95 fL (ref 79–97)
Monocytes Absolute: 0.5 x10E3/uL (ref 0.1–0.9)
Monocytes: 6 %
Neutrophils Absolute: 5.2 x10E3/uL (ref 1.4–7.0)
Neutrophils: 68 %
Platelets: 250 x10E3/uL (ref 150–450)
RBC: 5.22 x10E6/uL (ref 4.14–5.80)
RDW: 12.3 % (ref 11.6–15.4)
WBC: 7.5 x10E3/uL (ref 3.4–10.8)

## 2024-11-17 LAB — LIPID PANEL
Chol/HDL Ratio: 5.7 ratio — ABNORMAL HIGH (ref 0.0–5.0)
Cholesterol, Total: 198 mg/dL (ref 100–199)
HDL: 35 mg/dL — ABNORMAL LOW (ref >39)
LDL Chol Calc (NIH): 122 mg/dL — ABNORMAL HIGH (ref 0–99)
Triglycerides: 234 mg/dL — ABNORMAL HIGH (ref 0–149)
VLDL Cholesterol Cal: 41 mg/dL — ABNORMAL HIGH (ref 5–40)

## 2024-11-17 LAB — COMPREHENSIVE METABOLIC PANEL WITH GFR
ALT: 25 IU/L (ref 0–44)
AST: 24 IU/L (ref 0–40)
Albumin: 4.3 g/dL (ref 4.1–5.1)
Alkaline Phosphatase: 68 IU/L (ref 47–123)
BUN/Creatinine Ratio: 11 (ref 9–20)
BUN: 11 mg/dL (ref 6–24)
Bilirubin Total: <0.2 mg/dL (ref 0.0–1.2)
CO2: 23 mmol/L (ref 20–29)
Calcium: 9.1 mg/dL (ref 8.7–10.2)
Chloride: 101 mmol/L (ref 96–106)
Creatinine, Ser: 0.96 mg/dL (ref 0.76–1.27)
Globulin, Total: 2.7 g/dL (ref 1.5–4.5)
Glucose: 104 mg/dL — ABNORMAL HIGH (ref 70–99)
Potassium: 3.9 mmol/L (ref 3.5–5.2)
Sodium: 134 mmol/L (ref 134–144)
Total Protein: 7 g/dL (ref 6.0–8.5)
eGFR: 99 mL/min/1.73 (ref >59)

## 2024-11-17 LAB — HEPATITIS C ANTIBODY: Hep C Virus Ab: NONREACTIVE

## 2024-11-17 LAB — TSH: TSH: 2.23 u[IU]/mL (ref 0.450–4.500)

## 2024-11-17 LAB — TESTOSTERONE: Testosterone: 487 ng/dL (ref 264–916)

## 2024-11-17 LAB — HIV ANTIBODY (ROUTINE TESTING W REFLEX): HIV Screen 4th Generation wRfx: NONREACTIVE

## 2024-11-19 NOTE — Telephone Encounter (Signed)
 Duplicate request, refilled 11/16/24.  Requested Prescriptions  Pending Prescriptions Disp Refills   famotidine  (PEPCID ) 20 MG tablet [Pharmacy Med Name: FAMOTIDINE  20MG  TABLETS] 90 tablet     Sig: TAKE 1 TABLET(20 MG) BY MOUTH DAILY AS NEEDED FOR HEARTBURN OR INDIGESTION     Gastroenterology:  H2 Antagonists Passed - 11/19/2024  9:41 AM      Passed - Valid encounter within last 12 months    Recent Outpatient Visits           3 days ago Mixed hyperlipidemia   [place] [facility] at [facility], [place], [place]   2 months ago Scapular dyskinesis   [facility] Medicine at [facility], Selinda PARAS, [place]   5 months ago Internal derangement of right shoulder   [place] [facility] at [facility], Selinda PARAS, [place]   6 months ago Annual physical exam   [facility] Primary Care & Sports Medicine at [facility], Toribio SQUIBB, [place]       Future Appointments             In 6 months Manya, Toribio SQUIBB, [place] [facility] Primary Care & Sports Medicine at [facility], [phone] [place]

## 2024-11-23 ENCOUNTER — Encounter: Payer: Self-pay | Admitting: Family Medicine

## 2024-11-23 ENCOUNTER — Ambulatory Visit: Admitting: Family Medicine

## 2024-11-23 VITALS — BP 90/56 | HR 85 | Ht 64.0 in | Wt 183.0 lb

## 2024-11-23 DIAGNOSIS — Z23 Encounter for immunization: Secondary | ICD-10-CM

## 2024-11-23 DIAGNOSIS — G2589 Other specified extrapyramidal and movement disorders: Secondary | ICD-10-CM | POA: Diagnosis not present

## 2024-11-23 DIAGNOSIS — M7591 Shoulder lesion, unspecified, right shoulder: Secondary | ICD-10-CM | POA: Diagnosis not present

## 2024-11-23 MED ORDER — MELOXICAM 15 MG PO TABS: 15.0000 mg | ORAL_TABLET | Freq: Every day | ORAL | 2 refills | Status: AC | PRN

## 2024-11-23 MED ORDER — METHYLPREDNISOLONE ACETATE 40 MG/ML IJ SUSP
20.0000 mg | Freq: Once | INTRAMUSCULAR | Status: AC
  Administered 2024-11-23: 20 mg via INTRAMUSCULAR

## 2024-11-23 NOTE — Progress Notes (Signed)
 "    Primary Care / Sports Medicine Office Visit  Patient Information:  Patient ID: Rick Mason, adult DOB: 06-29-1976 Age: 48 y.o. MRN: [id number]   Rick Mason is a pleasant 48 y.o. adult presenting with the following:  Chief Complaint  Patient presents with   Shoulder Pain    Scapular pain has improved since physical therapy and taking the meloxicam  helps a lot.     Vitals:   11/23/24 1012  BP: (!) 90/56  Pulse: 85  SpO2: 98%   Vitals:   11/23/24 1012  Weight: 183 lb (83 kg)  Height: 5' 4 (1.626 m)   Body mass index is 31.41 kg/m.  No results found.   Discussed the use of AI scribe software for clinical note transcription with the patient, who gave verbal consent to proceed.   Independent interpretation of notes and tests performed by another provider:   None  Procedures performed:   Trigger point injections (5 total) were performed at the sites of maximal tenderness (left rhomboid minor, left rhomboid major, left upper trapezius) using 1% plain Lidocaine and 0.5 mL methylprednisolone  total. This was well tolerated.  Pertinent History, Exam, Impression, and Recommendations:   History of Present Illness Rick Mason is a 48 year old male with chronic right cervical and periscapular pain who presents for follow-up of persistent upper back muscle spasm and trigger points.  Cervical and periscapular pain - Chronic burning pain localized to the right scapular region. - Significant reduction in pain with physical therapy and meloxicam . - Symptoms recur when meloxicam  is discontinued. - No radiation of pain into the arm. - Pain worsens with leftward neck rotation.  Upper back muscle spasm and myofascial trigger points - Persistent tightness and focal myofascial trigger points in the right upper trapezius region. - Identifies a specific trigger point that, when compressed, improves range of motion but otherwise causes pain. - Uses muscle relaxants at bedtime;  symptoms recur upon discontinuation and prefers to use them as needed. - Single session of cupping in physical therapy provided temporary relief. - Has not received dry needling due to provider availability.  Activity modification and rehabilitation - Has not resumed gym activities but has started body weight exercises at home. - Expresses concern that increased activity may exacerbate symptoms. - Anticipates a busy work schedule that may limit ability to maintain regular rehabilitation. - Inquires about the utility of posture corrector devices as adjunctive support.  Physical Exam PALPATION: Focal tenderness at the origin of the levator scapula. Palpable muscular spasms throughout the right upper trapezius. No palpable spasm at the medial scapular border along the rhomboids. STRENGTH: Rotator cuff testing 5/5 strength, painless, full strength exam. SPECIAL TESTS: Negative Spurling's test bilaterally. Negative Neer's test on the right. Negative Hawkins test on the right. Negative for all relevant provocative maneuvers.  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 Improvement noted with physical therapy and meloxicam , though focal muscular tightness and trigger points persist. No radicular symptoms. Risk of exacerbation with strenuous activity or inadequate rehabilitation. - Performed trigger point injections at the right upper trapezius and origin of levator scapulae using corticosteroid, lidocaine, and bupivacaine for temporary pain relief and to facilitate rehabilitation. Discussed risks including transient soreness and rare complications; anticipated improved pain control and range of motion. -  Refilled meloxicam   for three months, to be used once daily as needed for analgesia. - Refilled muscle relaxant for bedtime use as needed, with recommendation to taper as symptoms improve with rehabilitation. - Provided home exercise program focused on postural correction and strengthening/stretching of relevant muscle groups; advised to resume and continue home rehabilitation exercises, especially post-holidays. - Advised that physical therapy may be revisited if home exercises are insufficient or if he encounters challenges with self-directed rehabilitation. - Discussed adjunctive use of posture corrector devices, emphasizing they should not replace exercise-based rehabilitation; offered to review efficacy data if considering purchase. - Recommended avoidance of strenuous upper body activity for 1-2 days post-injection; when returning to gym, advised focus on scapular stabilizer strengthening and avoidance of aggravating movements. - Instructed to contact via MyChart if symptoms persist or worsen after January/February despite adherence to home rehabilitation; cervical spine x-ray may be indicated to assess for alignment or degenerative changes, with MRI and possible referral for spine intervention if significant pathology is identified.  - Administered influenza vaccine in left deltoid.  Problem List Items Addressed This Visit     Scapular dyskinesis - Primary   Relevant Medications   meloxicam  (MOBIC ) 15 MG tablet   Supraspinatus tendinitis, right   Other Visit Diagnoses       Encounter for immunization       Relevant Orders   Flu vaccine trivalent PF, 6mos and older(Flulaval,Afluria,Fluarix,Fluzone) (Completed)        Orders & Medications Medications:  Meds ordered this encounter  Medications   methylPREDNISolone  acetate (DEPO-MEDROL ) injection 20 mg   meloxicam  (MOBIC ) 15 MG tablet    Sig: Take 1 tablet (15 mg total) by mouth daily as needed for pain.    Dispense:  30 tablet    Refill:  2    Orders Placed This Encounter  Procedures   Flu vaccine trivalent PF, 6mos and older(Flulaval,Afluria,Fluarix,Fluzone)     No follow-ups on file.     Selinda JINNY Ku, MD, [facility]   Primary Care Sports Medicine Primary Care and Sports Medicine at [facility]   "

## 2024-11-23 NOTE — Patient Instructions (Addendum)
 VISIT SUMMARY:  During your visit, we addressed your chronic right cervical and upper back pain, focusing on muscle spasms and trigger points. We performed trigger point injections, refilled your medications, and provided a home exercise program to help manage your symptoms.  YOUR PLAN:  CERVICAL AND PERISCAPULAR PAIN: Chronic burning pain in the right scapular region, worsened by certain neck movements. -Performed trigger point injections in the right upper trapezius and levator scapulae for pain relief. -Refilled meloxicam  for three months, to be taken once daily as needed. -Refilled muscle relaxant for bedtime use as needed, with a recommendation to taper as symptoms improve. -Provided a home exercise program focused on postural correction and strengthening/stretching of relevant muscle groups. -Advised to avoid strenuous upper body activity for 1-2 days post-injection. -When returning to the gym, focus on scapular stabilizer strengthening and avoid aggravating movements. -Contact us  via MyChart if symptoms persist or worsen after January/February.  UPPER BACK MUSCLE SPASM AND MYOFASCIAL TRIGGER POINTS: Persistent tightness and trigger points in the right upper trapezius region. -Continue using muscle relaxants at bedtime as needed. -Consider revisiting physical therapy if home exercises are insufficient. -If symptoms persist, a cervical spine x-ray may be needed to check for alignment or degenerative changes. -An MRI and possible referral for spine intervention may be necessary if significant pathology is found.  GENERAL HEALTH MAINTENANCE: Routine health maintenance. -Administered influenza vaccine in the left deltoid.

## 2025-05-19 ENCOUNTER — Encounter: Admitting: Physician Assistant
